# Patient Record
Sex: Male | Born: 1995 | Race: White | Hispanic: No | Marital: Single
Health system: Southern US, Community
[De-identification: ages and names within clinical notes are randomized; demographics above are authoritative.]

## PROBLEM LIST (undated history)

## (undated) DIAGNOSIS — R569 Unspecified convulsions: Secondary | ICD-10-CM

## (undated) DIAGNOSIS — B029 Zoster without complications: Secondary | ICD-10-CM

## (undated) DIAGNOSIS — K219 Gastro-esophageal reflux disease without esophagitis: Secondary | ICD-10-CM

## (undated) HISTORY — PX: TONSILLECTOMY: SUR1361

## (undated) HISTORY — PX: CHOLECYSTECTOMY: SHX55

## (undated) HISTORY — DX: Gastro-esophageal reflux disease without esophagitis: K21.9

## (undated) HISTORY — DX: Zoster without complications: B02.9

---

## 2020-09-30 ENCOUNTER — Emergency Department (HOSPITAL_COMMUNITY): Payer: BC Managed Care – PPO

## 2020-09-30 ENCOUNTER — Emergency Department (HOSPITAL_COMMUNITY)
Admission: EM | Admit: 2020-09-30 | Discharge: 2020-10-01 | Disposition: A | Discharge status: Home / Self Care | Payer: BC Managed Care – PPO | Attending: Emergency Medicine | Admitting: Emergency Medicine

## 2020-09-30 DIAGNOSIS — R569 Unspecified convulsions: Secondary | ICD-10-CM

## 2020-09-30 DIAGNOSIS — W19XXXA Unspecified fall, initial encounter: Secondary | ICD-10-CM | POA: Insufficient documentation

## 2020-09-30 DIAGNOSIS — Y906 Blood alcohol level of 120-199 mg/100 ml: Secondary | ICD-10-CM | POA: Insufficient documentation

## 2020-09-30 DIAGNOSIS — R791 Abnormal coagulation profile: Secondary | ICD-10-CM | POA: Insufficient documentation

## 2020-09-30 DIAGNOSIS — R109 Unspecified abdominal pain: Secondary | ICD-10-CM | POA: Diagnosis not present

## 2020-09-30 DIAGNOSIS — R200 Anesthesia of skin: Secondary | ICD-10-CM | POA: Diagnosis not present

## 2020-09-30 DIAGNOSIS — S199XXA Unspecified injury of neck, initial encounter: Secondary | ICD-10-CM | POA: Diagnosis not present

## 2020-09-30 DIAGNOSIS — S0990XA Unspecified injury of head, initial encounter: Secondary | ICD-10-CM | POA: Insufficient documentation

## 2020-09-30 DIAGNOSIS — M549 Dorsalgia, unspecified: Secondary | ICD-10-CM | POA: Diagnosis not present

## 2020-09-30 DIAGNOSIS — S299XXA Unspecified injury of thorax, initial encounter: Secondary | ICD-10-CM | POA: Insufficient documentation

## 2020-09-30 DIAGNOSIS — Z20822 Contact with and (suspected) exposure to covid-19: Secondary | ICD-10-CM | POA: Insufficient documentation

## 2020-09-30 DIAGNOSIS — G8384 Todd's paralysis (postepileptic): Secondary | ICD-10-CM

## 2020-09-30 DIAGNOSIS — M6281 Muscle weakness (generalized): Secondary | ICD-10-CM | POA: Diagnosis not present

## 2020-09-30 DIAGNOSIS — T1490XA Injury, unspecified, initial encounter: Secondary | ICD-10-CM

## 2020-09-30 LAB — URINALYSIS, MICROSCOPIC (REFLEX): Bacteria, UA: NONE SEEN

## 2020-09-30 LAB — COMPREHENSIVE METABOLIC PANEL
ALT: 17 U/L (ref 0–44)
AST: 25 U/L (ref 15–41)
Albumin: 4.6 g/dL (ref 3.5–5.0)
Alkaline Phosphatase: 55 U/L (ref 38–126)
Anion gap: 19 — ABNORMAL HIGH (ref 5–15)
BUN: 10 mg/dL (ref 6–20)
CO2: 13 mmol/L — ABNORMAL LOW (ref 22–32)
Calcium: 10 mg/dL (ref 8.9–10.3)
Chloride: 108 mmol/L (ref 98–111)
Creatinine, Ser: 1.01 mg/dL (ref 0.61–1.24)
GFR, Estimated: >60 mL/min (ref >60)
Glucose, Bld: 97 mg/dL (ref 70–99)
Potassium: 3.2 mmol/L — ABNORMAL LOW (ref 3.5–5.1)
Sodium: 140 mmol/L (ref 135–145)
Total Bilirubin: 0.7 mg/dL (ref 0.3–1.2)
Total Protein: 7.1 g/dL (ref 6.5–8.1)

## 2020-09-30 LAB — CBC
HCT: 44.3 % (ref 39.0–52.0)
Hemoglobin: 15.3 g/dL (ref 13.0–17.0)
MCH: 30.4 pg (ref 26.0–34.0)
MCHC: 34.5 g/dL (ref 30.0–36.0)
MCV: 87.9 fL (ref 80.0–100.0)
Platelets: 233 10*3/uL (ref 150–400)
RBC: 5.04 MIL/uL (ref 4.22–5.81)
RDW: 12.2 % (ref 11.5–15.5)
WBC: 9.6 10*3/uL (ref 4.0–10.5)
nRBC: 0 % (ref 0.0–0.2)

## 2020-09-30 LAB — SAMPLE TO BLOOD BANK

## 2020-09-30 LAB — URINALYSIS, ROUTINE W REFLEX MICROSCOPIC
Bilirubin Urine: NEGATIVE
Glucose, UA: NEGATIVE mg/dL
Ketones, ur: NEGATIVE mg/dL
Leukocytes,Ua: NEGATIVE
Nitrite: NEGATIVE
Protein, ur: NEGATIVE mg/dL
Specific Gravity, Urine: <1.005 — ABNORMAL LOW (ref 1.005–1.030)
pH: 6 (ref 5.0–8.0)

## 2020-09-30 LAB — I-STAT CHEM 8, ED
BUN: 7 mg/dL (ref 6–20)
Calcium, Ion: 1.2 mmol/L (ref 1.15–1.40)
Chloride: 108 mmol/L (ref 98–111)
Creatinine, Ser: 1 mg/dL (ref 0.61–1.24)
Glucose, Bld: 92 mg/dL (ref 70–99)
HCT: 46 % (ref 39.0–52.0)
Hemoglobin: 15.6 g/dL (ref 13.0–17.0)
Potassium: 3.1 mmol/L — ABNORMAL LOW (ref 3.5–5.1)
Sodium: 142 mmol/L (ref 135–145)
TCO2: 15 mmol/L — ABNORMAL LOW (ref 22–32)

## 2020-09-30 LAB — ETHANOL: Alcohol, Ethyl (B): 175 mg/dL — ABNORMAL HIGH (ref <10)

## 2020-09-30 LAB — PROTIME-INR
INR: 1 (ref 0.8–1.2)
Prothrombin Time: 13.1 seconds (ref 11.4–15.2)

## 2020-09-30 LAB — LACTIC ACID, PLASMA: Lactic Acid, Venous: 9.2 mmol/L (ref 0.5–1.9)
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
## 2020-09-30 IMAGING — DX DG CHEST 1V PORT
1 series · 1 of 1 positions shown · non-contrast
Comparison: Chest x-ray [DATE].

CLINICAL DATA: Trauma.

EXAM:
PORTABLE CHEST 1 VIEW

[chest]
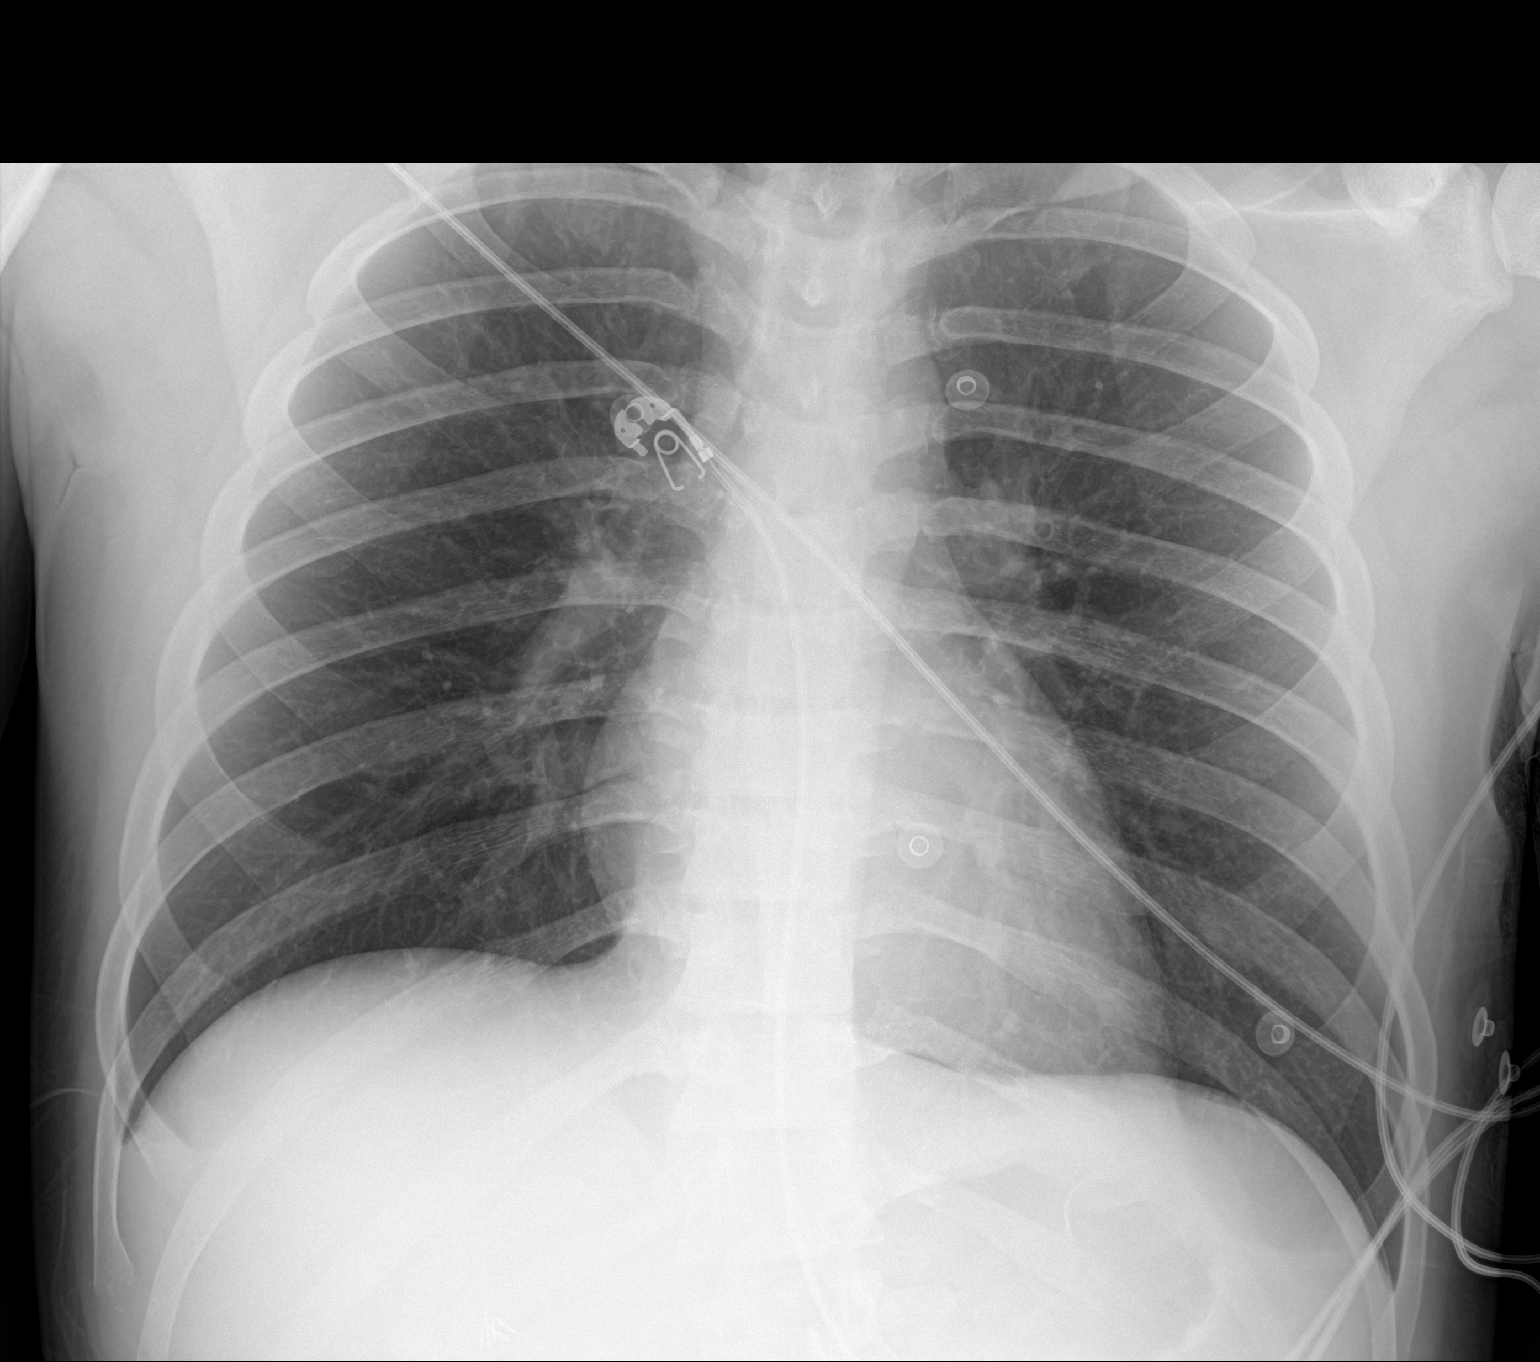

[1 of 1 positions shown; findings below may reference images not displayed]

FINDINGS: The heart size and mediastinal contours are within normal limits.
Both lungs are clear. The visualized skeletal structures are
unremarkable.
IMPRESSION: No active disease.
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
## 2020-09-30 MED ORDER — IOHEXOL 350 MG/ML SOLN
100.0000 mL | Freq: Once | INTRAVENOUS | Status: AC | PRN
  Administered 2020-09-30: 100 mL via INTRAVENOUS

## 2020-09-30 NOTE — ED Notes (Signed)
130/82 p 70
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
## 2020-09-30 NOTE — ED Notes (Signed)
The pt returned from c-t 
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
## 2020-09-30 NOTE — ED Notes (Signed)
To ct

## 2020-09-30 NOTE — ED Provider Notes (Signed)
[facility]   CSN: [id number] Arrival date & time: 09/30/20  2236     History No chief complaint on file.   Anthony Guerra is a 25 y.o. male.  The history is provided by medical records and the patient. No language interpreter was used.  Trauma Mechanism of injury: Fall Injury location: head/neck Injury location detail: neck and head Incident location: outdoors Arrived directly from scene: yes   Fall:      Fall occurred: running      Height of fall: 383ft ditch      Suspicion of alcohol use: yes      Suspicion of drug use: no  EMS/PTA data:      Ambulatory at scene: no      Blood loss: none      Airway interventions: none  Current symptoms:      Associated symptoms:            Reports back pain, headache and neck pain.            Denies abdominal pain, chest pain, nausea and vomiting. Seizures: no acute seizures seen here.     No past medical history on file.  There are no problems to display for this patient.     No family history on file.     Home Medications Prior to Admission medications   Not on File    Allergies    Patient has no allergy information on record.  Review of Systems   Review of Systems  Unable to perform ROS: Acuity of condition  Constitutional:  Negative for chills.  HENT:  Negative for congestion.   Respiratory:  Negative for cough, chest tightness, shortness of breath and wheezing.   Cardiovascular:  Negative for chest pain.  Gastrointestinal:  Negative for abdominal pain, constipation, diarrhea, nausea and vomiting.  Genitourinary:  Negative for flank pain.  Musculoskeletal:  Positive for back pain and neck pain.  Skin:  Negative for rash and wound.  Neurological:  Positive for weakness, numbness and headaches. Negative for speech difficulty. Seizures: no acute seizures seen here. All other systems reviewed and are negative.  Physical Exam Updated Vital Signs BP  121/77   Pulse 60   Temp (!) 96.8 F (36 C)   Resp 19   SpO2 99%   Physical Exam Vitals and nursing note reviewed.  Constitutional:      General: He is in acute distress.     Appearance: He is well-developed. He is ill-appearing.  HENT:     Head: Normocephalic and atraumatic.     Nose: No congestion or rhinorrhea.     Mouth/Throat:     Mouth: Mucous membranes are moist.     Pharynx: No oropharyngeal exudate or posterior oropharyngeal erythema.  Eyes:     Extraocular Movements: Extraocular movements intact.     Conjunctiva/sclera: Conjunctivae normal.     Pupils: Pupils are equal, round, and reactive to light.  Neck:     Comments: In C collar Cardiovascular:     Rate and Rhythm: Normal rate and regular rhythm.     Heart sounds: No murmur heard. Pulmonary:     Effort: Pulmonary effort is normal. No respiratory distress.     Breath sounds: Normal breath sounds. No wheezing, rhonchi or rales.  Chest:     Chest wall: No tenderness.  Abdominal:     General: Abdomen is flat.     Palpations: Abdomen is soft.  Tenderness: There is no abdominal tenderness. There is no right CVA tenderness, left CVA tenderness, guarding or rebound.  Musculoskeletal:        General: Tenderness present.     Cervical back: Tenderness present.  Skin:    General: Skin is warm and dry.     Findings: No erythema.  Neurological:     Mental Status: He is alert.     Sensory: Sensory deficit present.     Motor: Weakness present.     Comments: Patient has numbness and weakness of right arm and right leg compared to left.  Intact sensation and strength in left side.  Decreased rectal tone on my exam.  Tenderness in the cervical spine and occiput.  Pupils symmetric and reactive.  Speech clear when he is awake.   symmetric smile.      ED Results / Procedures / Treatments   Labs (all labs ordered are listed, but only abnormal results are displayed) Labs Reviewed  COMPREHENSIVE METABOLIC PANEL - Abnormal;  Notable for the following components:      Result Value   Potassium 3.2 (*)    CO2 13 (*)    Anion gap 19 (*)    All other components within normal limits  ETHANOL - Abnormal; Notable for the following components:   Alcohol, Ethyl (B) 175 (*)    All other components within normal limits  LACTIC ACID, PLASMA - Abnormal; Notable for the following components:   Lactic Acid, Venous 9.2 (*)    All other components within normal limits  URINALYSIS, ROUTINE W REFLEX MICROSCOPIC - Abnormal; Notable for the following components:   Specific Gravity, Urine <1.005 (*)    Hgb urine dipstick TRACE (*)    All other components within normal limits  I-STAT CHEM 8, ED - Abnormal; Notable for the following components:   Potassium 3.1 (*)    TCO2 15 (*)    All other components within normal limits  RESP PANEL BY RT-PCR (FLU A&B, COVID) ARPGX2  CBC  PROTIME-INR  URINALYSIS, MICROSCOPIC (REFLEX)  SAMPLE TO BLOOD BANK    EKG None  Radiology CT HEAD WO CONTRAST ( )  Result Date: 09/30/2020 CLINICAL DATA:  Head trauma, mod-severe; Neck trauma, focal neuro deficit or paresthesia (Age 38-64y); Abdominal trauma; Chest trauma, mod-severe. Intoxication, fall from height, spinal deformity EXAM: CT HEAD WITHOUT CONTRAST CT CERVICAL SPINE WITHOUT CONTRAST CT CHEST, ABDOMEN AND PELVIS WITH CONTRAST TECHNIQUE: Contiguous axial images were obtained from the base of the skull through the vertex without intravenous contrast. Multidetector CT imaging of the cervical spine was performed without intravenous contrast. Multiplanar CT image reconstructions were also generated. Multidetector CT imaging of the chest, abdomen and pelvis was performed following the standard protocol during bolus administration of intravenous contrast. CONTRAST:  OMNIPAQUE IOHEXOL 350 MG/ML SOLN COMPARISON:  None. FINDINGS: CT HEAD FINDINGS Brain: Normal anatomic configuration. No abnormal intra or extra-axial mass lesion or fluid  collection. No abnormal mass effect or midline shift. No evidence of acute intracranial hemorrhage or infarct. Ventricular size is normal. Cerebellum unremarkable. Vascular: Unremarkable Skull: Intact Sinuses/Orbits: Paranasal sinuses are clear. Orbits are unremarkable. Other: Mastoid air cells and middle ear cavities are clear. CT CERVICAL FINDINGS Alignment: Normal. Skull base and vertebrae: No acute fracture. No primary bone lesion or focal pathologic process. Soft tissues and spinal canal: No prevertebral fluid or swelling. No visible canal hematoma. Disc levels: Intervertebral disc heights and vertebral body heights are preserved. The prevertebral soft tissues are not thickened. No significant  canal stenosis or neuroforaminal narrowing. No arthropathy. Other:  None CT CHEST FINDINGS Cardiovascular: No significant vascular findings. Normal heart size. No pericardial effusion. Mediastinum/Nodes: No enlarged mediastinal, hilar, or axillary lymph nodes. Thyroid gland, trachea, and esophagus demonstrate no significant findings. Lungs/Pleura: Lungs are clear. No pleural effusion or pneumothorax. Musculoskeletal: No acute bone abnormality within the thorax. CT ABDOMEN PELVIS FINDINGS Hepatobiliary: No focal liver abnormality is seen. No subcapsular or perihepatic fluid collection. Status post cholecystectomy. No biliary dilatation. Pancreas: Unremarkable Spleen: No splenic injury or perisplenic hematoma. Adrenals/Urinary Tract: The adrenal glands are unremarkable. The kidneys are normal in size and position. Normal cortical enhancement. No perirenal fluid identified. There is multiple punctate nonobstructing calculi noted within the left kidney measuring up to 3 mm in greatest dimension. No hydronephrosis. No ureteral calculi. The bladder is unremarkable. Stomach/Bowel: Stomach is within normal limits. Appendix appears normal. No evidence of bowel wall thickening, distention, or inflammatory changes. No free  intraperitoneal gas or fluid. Vascular/Lymphatic: No significant vascular findings are present. No enlarged abdominal or pelvic lymph nodes. Reproductive: Prostate is unremarkable. Other: No abdominal wall hernia. Musculoskeletal: No acute bone abnormality within the abdomen and pelvis. IMPRESSION: No acute intracranial injury.  No calvarial fracture. No acute fracture or listhesis of the cervical spine. No acute intrathoracic or intra-abdominal injury. Electronically Signed   By: Helyn Numbers M.D.   On: 09/30/2020 23:37   CT Chest W Contrast  Result Date: 09/30/2020 CLINICAL DATA:  Head trauma, mod-severe; Neck trauma, focal neuro deficit or paresthesia (Age 39-64y); Abdominal trauma; Chest trauma, mod-severe. Intoxication, fall from height, spinal deformity EXAM: CT HEAD WITHOUT CONTRAST CT CERVICAL SPINE WITHOUT CONTRAST CT CHEST, ABDOMEN AND PELVIS WITH CONTRAST TECHNIQUE: Contiguous axial images were obtained from the base of the skull through the vertex without intravenous contrast. Multidetector CT imaging of the cervical spine was performed without intravenous contrast. Multiplanar CT image reconstructions were also generated. Multidetector CT imaging of the chest, abdomen and pelvis was performed following the standard protocol during bolus administration of intravenous contrast. CONTRAST:  OMNIPAQUE IOHEXOL 350 MG/ML SOLN COMPARISON:  None. FINDINGS: CT HEAD FINDINGS Brain: Normal anatomic configuration. No abnormal intra or extra-axial mass lesion or fluid collection. No abnormal mass effect or midline shift. No evidence of acute intracranial hemorrhage or infarct. Ventricular size is normal. Cerebellum unremarkable. Vascular: Unremarkable Skull: Intact Sinuses/Orbits: Paranasal sinuses are clear. Orbits are unremarkable. Other: Mastoid air cells and middle ear cavities are clear. CT CERVICAL FINDINGS Alignment: Normal. Skull base and vertebrae: No acute fracture. No primary bone lesion or  focal pathologic process. Soft tissues and spinal canal: No prevertebral fluid or swelling. No visible canal hematoma. Disc levels: Intervertebral disc heights and vertebral body heights are preserved. The prevertebral soft tissues are not thickened. No significant canal stenosis or neuroforaminal narrowing. No arthropathy. Other:  None CT CHEST FINDINGS Cardiovascular: No significant vascular findings. Normal heart size. No pericardial effusion. Mediastinum/Nodes: No enlarged mediastinal, hilar, or axillary lymph nodes. Thyroid gland, trachea, and esophagus demonstrate no significant findings. Lungs/Pleura: Lungs are clear. No pleural effusion or pneumothorax. Musculoskeletal: No acute bone abnormality within the thorax. CT ABDOMEN PELVIS FINDINGS Hepatobiliary: No focal liver abnormality is seen. No subcapsular or perihepatic fluid collection. Status post cholecystectomy. No biliary dilatation. Pancreas: Unremarkable Spleen: No splenic injury or perisplenic hematoma. Adrenals/Urinary Tract: The adrenal glands are unremarkable. The kidneys are normal in size and position. Normal cortical enhancement. No perirenal fluid identified. There is multiple punctate nonobstructing calculi noted within the left kidney  measuring up to 3 mm in greatest dimension. No hydronephrosis. No ureteral calculi. The bladder is unremarkable. Stomach/Bowel: Stomach is within normal limits. Appendix appears normal. No evidence of bowel wall thickening, distention, or inflammatory changes. No free intraperitoneal gas or fluid. Vascular/Lymphatic: No significant vascular findings are present. No enlarged abdominal or pelvic lymph nodes. Reproductive: Prostate is unremarkable. Other: No abdominal wall hernia. Musculoskeletal: No acute bone abnormality within the abdomen and pelvis. IMPRESSION: No acute intracranial injury.  No calvarial fracture. No acute fracture or listhesis of the cervical spine. No acute intrathoracic or intra-abdominal  injury. Electronically Signed   By: Helyn Numbers M.D.   On: 09/30/2020 23:37   CT Cervical Spine Wo Contrast  Result Date: 09/30/2020 CLINICAL DATA:  Head trauma, mod-severe; Neck trauma, focal neuro deficit or paresthesia (Age 70-64y); Abdominal trauma; Chest trauma, mod-severe. Intoxication, fall from height, spinal deformity EXAM: CT HEAD WITHOUT CONTRAST CT CERVICAL SPINE WITHOUT CONTRAST CT CHEST, ABDOMEN AND PELVIS WITH CONTRAST TECHNIQUE: Contiguous axial images were obtained from the base of the skull through the vertex without intravenous contrast. Multidetector CT imaging of the cervical spine was performed without intravenous contrast. Multiplanar CT image reconstructions were also generated. Multidetector CT imaging of the chest, abdomen and pelvis was performed following the standard protocol during bolus administration of intravenous contrast. CONTRAST:  OMNIPAQUE IOHEXOL 350 MG/ML SOLN COMPARISON:  None. FINDINGS: CT HEAD FINDINGS Brain: Normal anatomic configuration. No abnormal intra or extra-axial mass lesion or fluid collection. No abnormal mass effect or midline shift. No evidence of acute intracranial hemorrhage or infarct. Ventricular size is normal. Cerebellum unremarkable. Vascular: Unremarkable Skull: Intact Sinuses/Orbits: Paranasal sinuses are clear. Orbits are unremarkable. Other: Mastoid air cells and middle ear cavities are clear. CT CERVICAL FINDINGS Alignment: Normal. Skull base and vertebrae: No acute fracture. No primary bone lesion or focal pathologic process. Soft tissues and spinal canal: No prevertebral fluid or swelling. No visible canal hematoma. Disc levels: Intervertebral disc heights and vertebral body heights are preserved. The prevertebral soft tissues are not thickened. No significant canal stenosis or neuroforaminal narrowing. No arthropathy. Other:  None CT CHEST FINDINGS Cardiovascular: No significant vascular findings. Normal heart size. No pericardial  effusion. Mediastinum/Nodes: No enlarged mediastinal, hilar, or axillary lymph nodes. Thyroid gland, trachea, and esophagus demonstrate no significant findings. Lungs/Pleura: Lungs are clear. No pleural effusion or pneumothorax. Musculoskeletal: No acute bone abnormality within the thorax. CT ABDOMEN PELVIS FINDINGS Hepatobiliary: No focal liver abnormality is seen. No subcapsular or perihepatic fluid collection. Status post cholecystectomy. No biliary dilatation. Pancreas: Unremarkable Spleen: No splenic injury or perisplenic hematoma. Adrenals/Urinary Tract: The adrenal glands are unremarkable. The kidneys are normal in size and position. Normal cortical enhancement. No perirenal fluid identified. There is multiple punctate nonobstructing calculi noted within the left kidney measuring up to 3 mm in greatest dimension. No hydronephrosis. No ureteral calculi. The bladder is unremarkable. Stomach/Bowel: Stomach is within normal limits. Appendix appears normal. No evidence of bowel wall thickening, distention, or inflammatory changes. No free intraperitoneal gas or fluid. Vascular/Lymphatic: No significant vascular findings are present. No enlarged abdominal or pelvic lymph nodes. Reproductive: Prostate is unremarkable. Other: No abdominal wall hernia. Musculoskeletal: No acute bone abnormality within the abdomen and pelvis. IMPRESSION: No acute intracranial injury.  No calvarial fracture. No acute fracture or listhesis of the cervical spine. No acute intrathoracic or intra-abdominal injury. Electronically Signed   By: Helyn Numbers M.D.   On: 09/30/2020 23:37   CT ABDOMEN PELVIS W CONTRAST  Result Date:  09/30/2020 CLINICAL DATA:  Head trauma, mod-severe; Neck trauma, focal neuro deficit or paresthesia (Age 71-64y); Abdominal trauma; Chest trauma, mod-severe. Intoxication, fall from height, spinal deformity EXAM: CT HEAD WITHOUT CONTRAST CT CERVICAL SPINE WITHOUT CONTRAST CT CHEST, ABDOMEN AND PELVIS WITH CONTRAST  TECHNIQUE: Contiguous axial images were obtained from the base of the skull through the vertex without intravenous contrast. Multidetector CT imaging of the cervical spine was performed without intravenous contrast. Multiplanar CT image reconstructions were also generated. Multidetector CT imaging of the chest, abdomen and pelvis was performed following the standard protocol during bolus administration of intravenous contrast. CONTRAST:  OMNIPAQUE IOHEXOL 350 MG/ML SOLN COMPARISON:  None. FINDINGS: CT HEAD FINDINGS Brain: Normal anatomic configuration. No abnormal intra or extra-axial mass lesion or fluid collection. No abnormal mass effect or midline shift. No evidence of acute intracranial hemorrhage or infarct. Ventricular size is normal. Cerebellum unremarkable. Vascular: Unremarkable Skull: Intact Sinuses/Orbits: Paranasal sinuses are clear. Orbits are unremarkable. Other: Mastoid air cells and middle ear cavities are clear. CT CERVICAL FINDINGS Alignment: Normal. Skull base and vertebrae: No acute fracture. No primary bone lesion or focal pathologic process. Soft tissues and spinal canal: No prevertebral fluid or swelling. No visible canal hematoma. Disc levels: Intervertebral disc heights and vertebral body heights are preserved. The prevertebral soft tissues are not thickened. No significant canal stenosis or neuroforaminal narrowing. No arthropathy. Other:  None CT CHEST FINDINGS Cardiovascular: No significant vascular findings. Normal heart size. No pericardial effusion. Mediastinum/Nodes: No enlarged mediastinal, hilar, or axillary lymph nodes. Thyroid gland, trachea, and esophagus demonstrate no significant findings. Lungs/Pleura: Lungs are clear. No pleural effusion or pneumothorax. Musculoskeletal: No acute bone abnormality within the thorax. CT ABDOMEN PELVIS FINDINGS Hepatobiliary: No focal liver abnormality is seen. No subcapsular or perihepatic fluid collection. Status post cholecystectomy.  No biliary dilatation. Pancreas: Unremarkable Spleen: No splenic injury or perisplenic hematoma. Adrenals/Urinary Tract: The adrenal glands are unremarkable. The kidneys are normal in size and position. Normal cortical enhancement. No perirenal fluid identified. There is multiple punctate nonobstructing calculi noted within the left kidney measuring up to 3 mm in greatest dimension. No hydronephrosis. No ureteral calculi. The bladder is unremarkable. Stomach/Bowel: Stomach is within normal limits. Appendix appears normal. No evidence of bowel wall thickening, distention, or inflammatory changes. No free intraperitoneal gas or fluid. Vascular/Lymphatic: No significant vascular findings are present. No enlarged abdominal or pelvic lymph nodes. Reproductive: Prostate is unremarkable. Other: No abdominal wall hernia. Musculoskeletal: No acute bone abnormality within the abdomen and pelvis. IMPRESSION: No acute intracranial injury.  No calvarial fracture. No acute fracture or listhesis of the cervical spine. No acute intrathoracic or intra-abdominal injury. Electronically Signed   By: Helyn Numbers M.D.   On: 09/30/2020 23:37   DG Chest Port 1 View  Result Date: 09/30/2020 CLINICAL DATA:  Trauma. EXAM: PORTABLE CHEST 1 VIEW COMPARISON:  Chest x-ray 12/26/2016. FINDINGS: The heart size and mediastinal contours are within normal limits. Both lungs are clear. The visualized skeletal structures are unremarkable. IMPRESSION: No active disease. Electronically Signed   By: Darliss Cheney M.D.   On: 09/30/2020 23:07    Procedures Procedures   Medications Ordered in ED Medications  iohexol (OMNIPAQUE) 350 MG/ML injection 100 mL (100 mLs Intravenous Contrast Given 09/30/20 2314)    ED Course  I have reviewed the triage vital signs and the nursing notes.  Pertinent labs & imaging results that were available during my care of the patient were reviewed by me and considered in my medical decision  making (see chart for  details).    MDM Rules/Calculators/A&P                           Anthony Guerra is a 25 y.o. male with a reported past medical history significant for seizures who presents as a level 2 trauma.  According to EMS, patient was running and tried to jump over a ditch when he "face planted" and his legs and body bent backwards over him in a scorpion like position causing immediate onset of pain to his neck.  He also had sudden onset of numbness and weakness in his right arm and right leg and was altered.  According to EMS, patient was unable to feel or move his right leg and during transfer that is progressed to his right arm.  Patient is reportedly right-handed.  He is reporting significant headache and neck pain but is denying any back pain or chest pain or abdominal pain.  Airway appears to be intact and breath sounds are equal bilaterally.  Chest was nontender but EMS reported possible tenderness on the right chest wall initially.  Portable chest x-ray was obtained and I did not see evidence of acute rib fracture or pneumothorax.  On further exam, patient has no sensation in his right leg and minimal sensation in his right arm.  Also significant weakness in the right leg and right arm.  He was having some posturing on the right arm initially.  Symmetric smile.  Speech is clear when he wakes up.  Symmetric smile.  Neck is tender and in cervical mobilization collar.   Clinically I am very concerned about a cord injury or spinal fracture in the C-spine.  We will get CT pan scans.  Patient quickly went to CT scanner and the bony imaging did not show evidence of acute fracture.  I spoke with neurosurgery who recommends MRI and MRA of the head and neck to look for cord injury or intracranial injury.  Given his history of seizures, it is possible to have a Todd's paralysis however given the numbness in the right leg this was felt to be less likely.  Patient will get MRI for further evaluation and  anticipate he will need admission for further management.   Final Clinical Impression(s) / ED Diagnoses Final diagnoses:  Trauma     Clinical Impression: 1. Trauma     Disposition: Admit  This note was prepared with assistance of Dragon voice recognition software. Occasional wrong-word or sound-a-like substitutions may have occurred due to the inherent limitations of voice recognition software.     Tynika Luddy, Canary Brim, MD 10/01/20 [phone]

## 2020-10-01 ENCOUNTER — Emergency Department (HOSPITAL_COMMUNITY): Payer: BC Managed Care – PPO

## 2020-10-01 LAB — RESP PANEL BY RT-PCR (FLU A&B, COVID) ARPGX2
Influenza A by PCR: NEGATIVE
Influenza B by PCR: NEGATIVE
SARS Coronavirus 2 by RT PCR: NEGATIVE

## 2020-10-01 IMAGING — MR MR HEAD W/O CM
6 of 10 series · 28 of 48 positions shown · IV contrast (gadavist)
Comparison: CT from [DATE].

CLINICAL DATA: Initial evaluation for right-sided numbness and
weakness.

EXAM:
MRI HEAD WITHOUT CONTRAST
MRA HEAD WITHOUT CONTRAST
MRA NECK WITHOUT AND WITH CONTRAST
TECHNIQUE: Multiplanar, multi-echo pulse sequences of the brain and surrounding
structures were acquired without intravenous contrast. Angiographic
images of the Circle of Willis were acquired using MRA technique
without intravenous contrast. Angiographic images of the neck were
acquired using MRA technique without and with intravenous contrast.
Carotid stenosis measurements (when applicable) are obtained
utilizing NASCET criteria, using the distal internal carotid
diameter as the denominator.
CONTRAST:  7mL GADAVIST GADOBUTROL 1 MMOL/ML IV SOLN

[Series 2: DWI · axial · 3.0mm · 0.94mm/px · z∈[-84,+69]mm · 9 of 104 slices shown (1 of 2)]
[im 1/104]
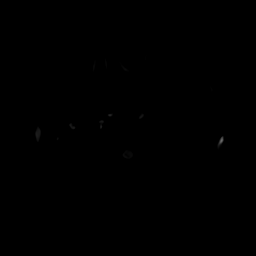
[im 13/104]
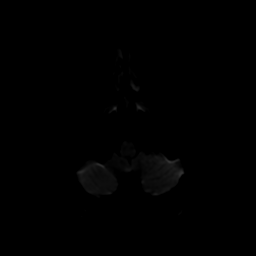
[im 26/104]
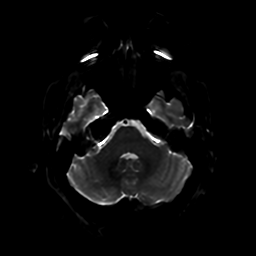
[im 39/104]
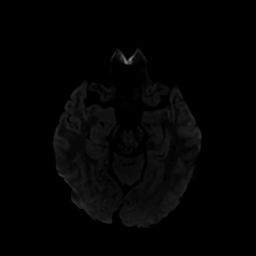
[im 52/104]
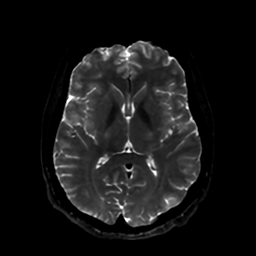
[im 65/104]
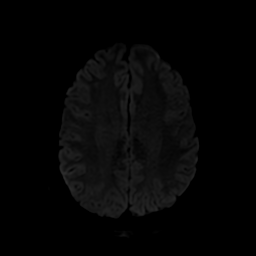
[im 78/104]
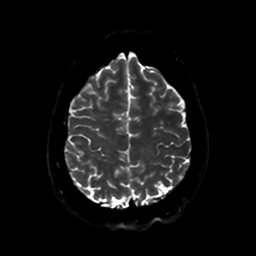
[im 91/104]
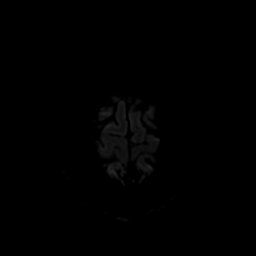
[im 104/104]
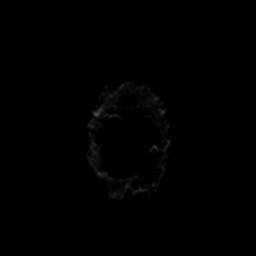

[Series 3: DWI · coronal · 4.0mm · 0.94mm/px · 7 of 79 slices shown (2 of 2)]
[im 1/79]
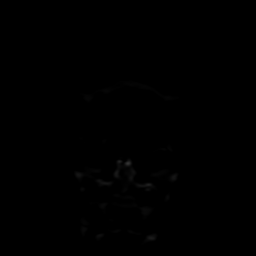
[im 14/79]
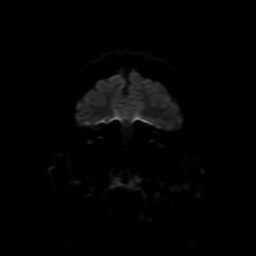
[im 27/79]
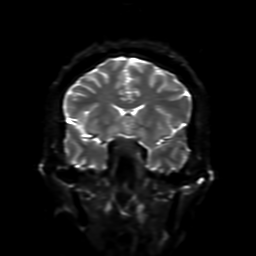
[im 40/79]
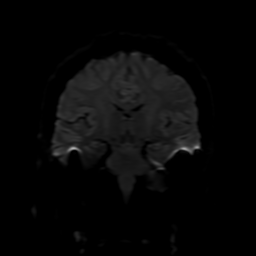
[im 53/79]
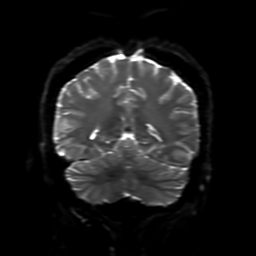
[im 66/79]
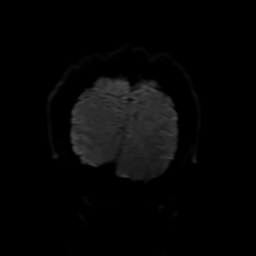
[im 79/79]
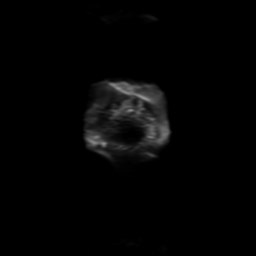

[Series 4: FLAIR · sagittal · 5.0mm · 0.23mm/px · 2 of 26 slices shown (1 of 2)]
[im 1/26]
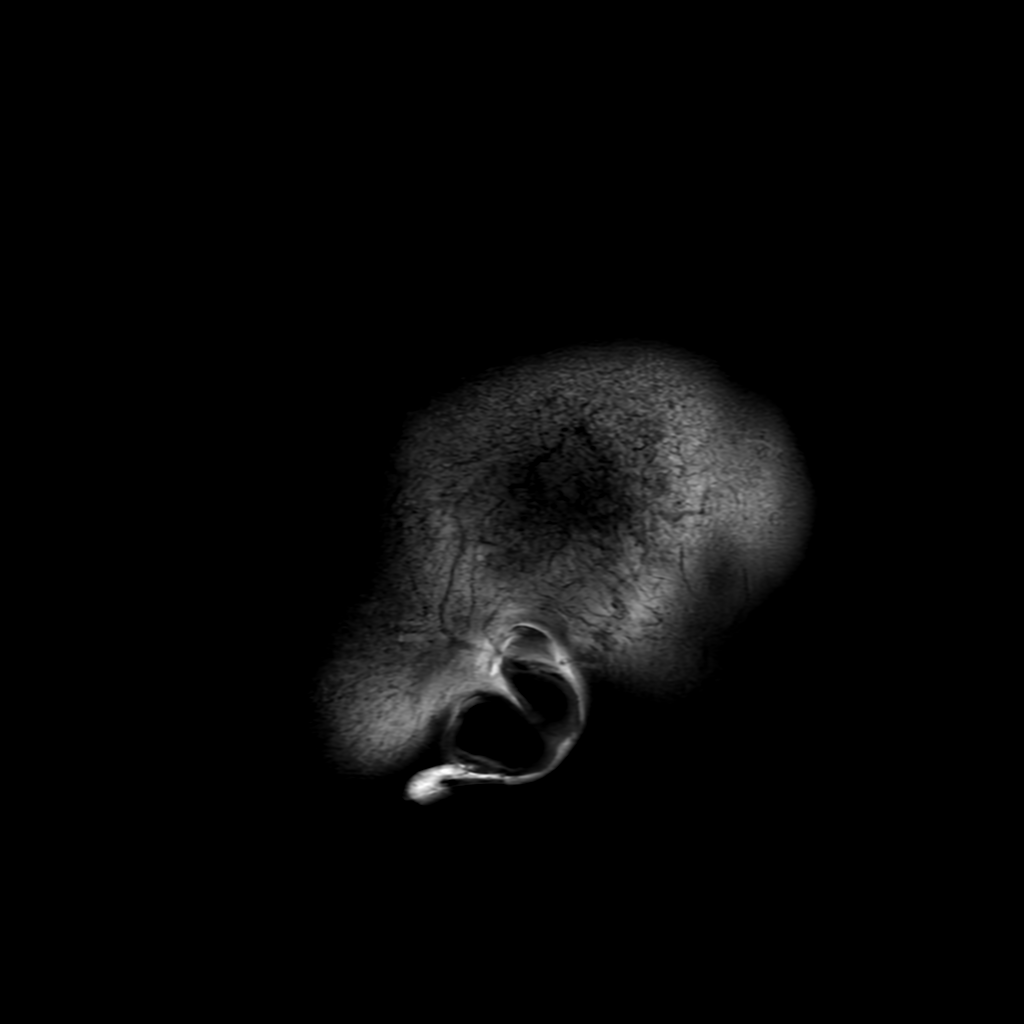
[im 26/26]
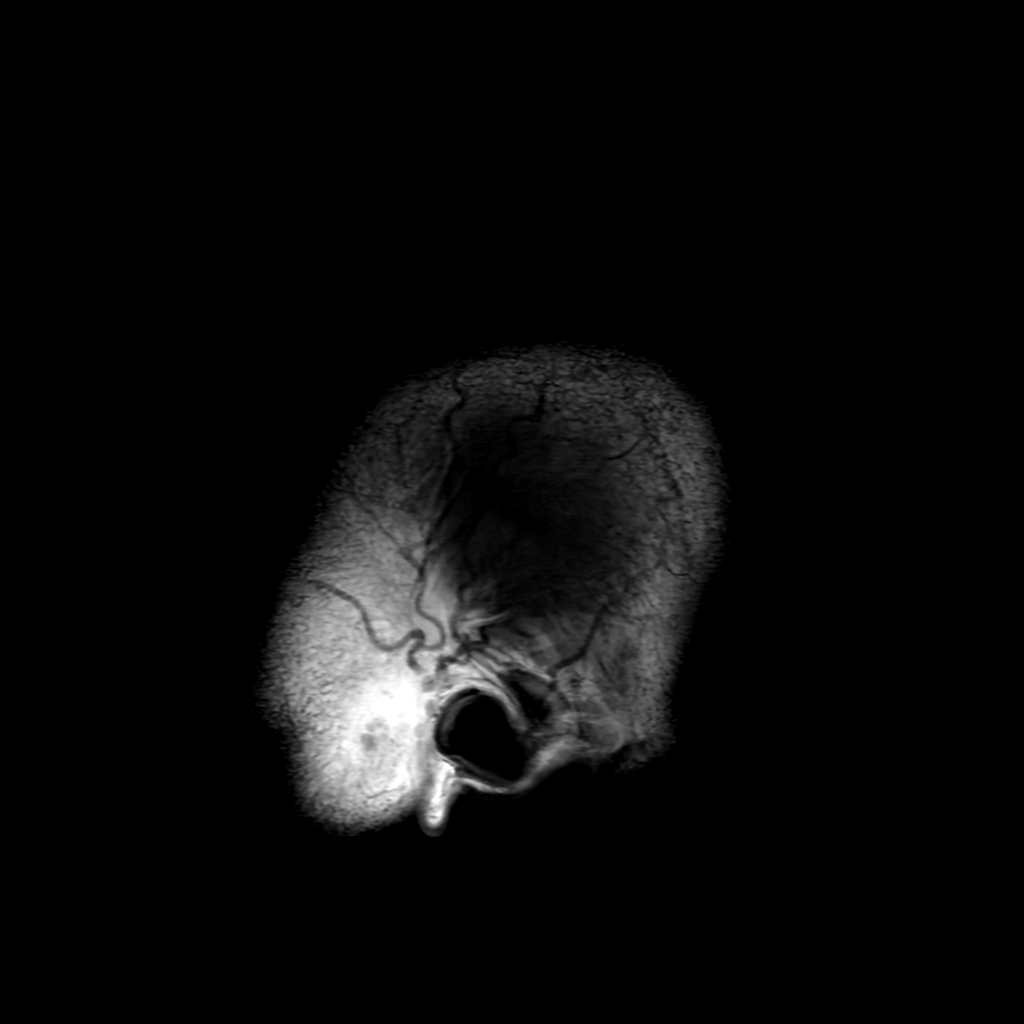

[Series 6: FLAIR · axial · 4.0mm · 0.45mm/px · z∈[-94,+54]mm · 3 of 35 slices shown (2 of 2)]
[im 1/35]
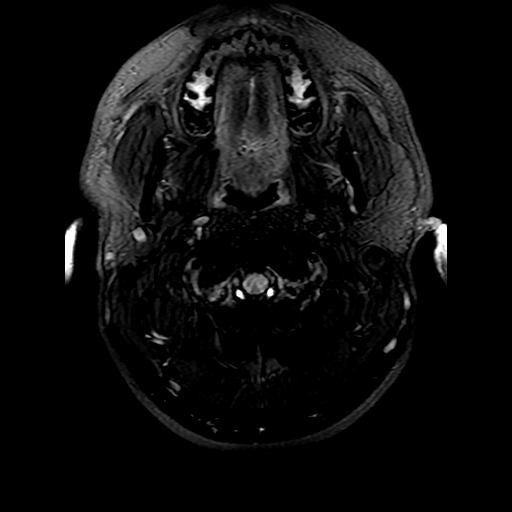
[im 18/35]
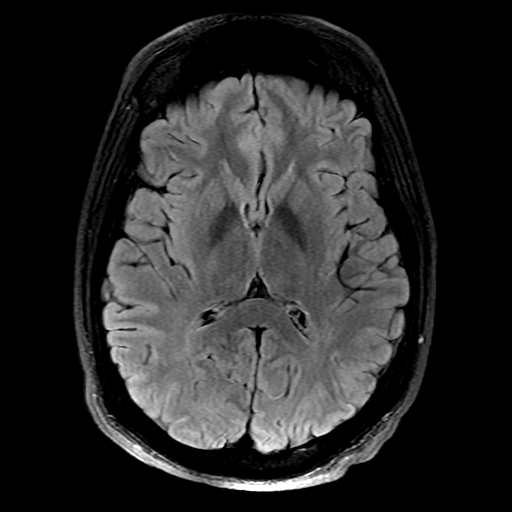
[im 35/35]
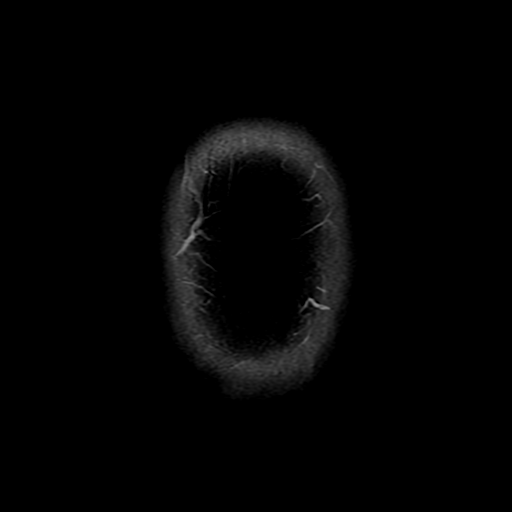

[Series 250: ADC · axial · 3.0mm · 0.94mm/px · z∈[-84,+69]mm · 5 of 52 slices shown (1 of 2)]
[im 1/52]
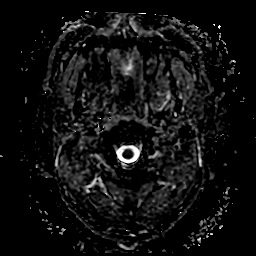
[im 13/52]
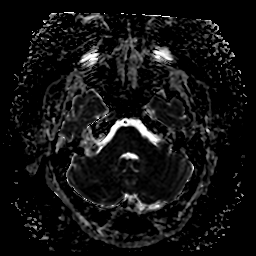
[im 26/52]
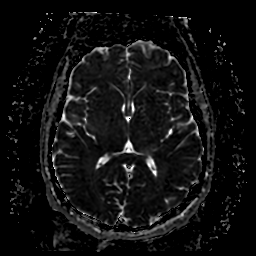
[im 39/52]
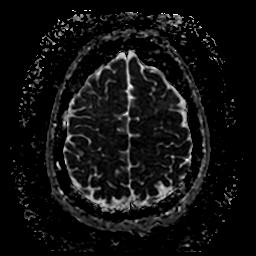
[im 52/52]
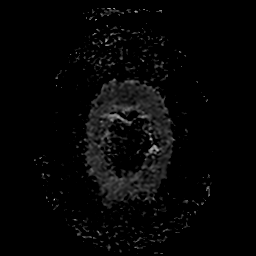

[Series 350: ADC · coronal · 4.0mm · 0.94mm/px · 2 of 40 slices shown (2 of 2)]
[im 1/40]
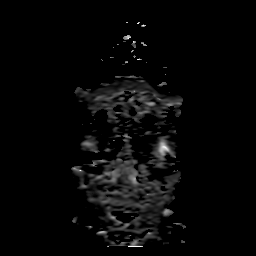
[im 20/40]
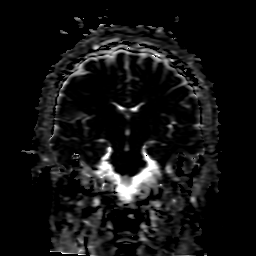

[28 of 48 positions shown; findings below may reference images not displayed]

FINDINGS: MRI HEAD FINDINGS

Brain: Cerebral volume within normal limits for patient age. No
focal parenchymal signal abnormality identified.

No abnormal foci of restricted diffusion to suggest acute or
subacute ischemia. Gray-white matter differentiation well
maintained. No encephalomalacia to suggest chronic infarction. No
foci of susceptibility artifact to suggest acute or chronic
intracranial hemorrhage.

No mass lesion, midline shift or mass effect. No hydrocephalus. No
extra-axial fluid collection.

Pituitary gland and suprasellar region are normal. Midline
structures intact and normal.

Vascular: Major intracranial vascular flow voids well maintained and
normal in appearance.

Skull and upper cervical spine: Craniocervical junction normal.
Visualized upper cervical spine within normal limits. Bone marrow
signal intensity normal. No scalp soft tissue abnormality.

Sinuses/Orbits: Globes and orbital soft tissues within normal
limits.

Mild scattered mucosal thickening noted within the ethmoidal air
cells. Paranasal sinuses are otherwise largely clear. Trace right
mastoid effusion, of doubtful significance. Inner ear structures
grossly normal.

Other: None.

MRA HEAD FINDINGS

Anterior circulation: Both internal carotid arteries widely patent
to the termini without stenosis. A1 segments widely patent. Normal
anterior communicating artery complex. Both anterior cerebral
arteries widely patent to their distal aspects without stenosis. No
M1 stenosis or occlusion. Normal MCA bifurcations. Distal MCA
branches well perfused and symmetric.

Posterior circulation: Both V4 segments patent to the
vertebrobasilar junction without stenosis. Both PICA origins patent
and normal. Basilar widely patent to its distal aspect without
stenosis. Superior cerebellar arteries patent bilaterally. Both PCAs
primarily supplied via the basilar and are well perfused to there
distal aspects.

Anatomic variants: None significant.  No aneurysm.

MRA NECK FINDINGS

Aortic arch: Visualized aortic arch normal in caliber with normal
branch pattern. No stenosis or other abnormality about the origin of
the great vessels.

Right carotid system: Right common and internal carotid arteries
widely patent without stenosis, evidence for dissection or
occlusion.

Left carotid system: Left common and internal carotid arteries
widely patent without stenosis, evidence for dissection or
occlusion.

Vertebral arteries: Left vertebral artery arises directly from the
aortic arch. Vertebral arteries patent without stenosis, evidence
for dissection or occlusion.

Other: None
IMPRESSION: 1. Normal brain MRI. No acute intracranial abnormality.
2. Normal intracranial MRA.
3. Normal MRA of the neck.

## 2020-10-01 IMAGING — MR MR CERVICAL SPINE W/O CM
4 of 6 series · 19 of 48 positions shown · non-contrast
Comparison: Prior CT from [DATE].

CLINICAL DATA: Initial evaluation for acute neck trauma.

EXAM:
MRI CERVICAL SPINE WITHOUT CONTRAST
TECHNIQUE: Multiplanar, multisequence MR imaging of the cervical spine was
performed. No intravenous contrast was administered.

[Series 3: T2 · sagittal · 3.0mm · 0.43mm/px · 2 of 16 slices shown (1 of 2)]
[im 1/16]
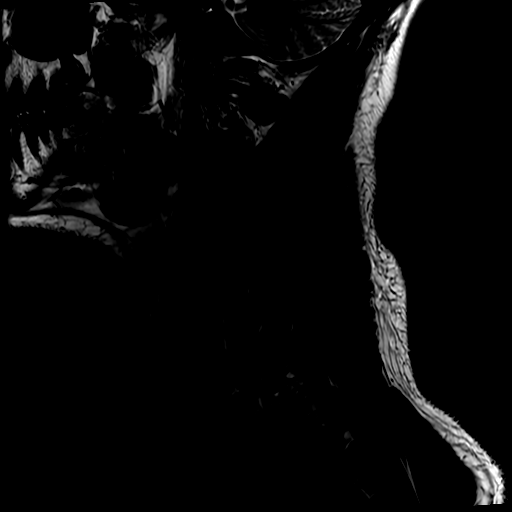
[im 16/16]
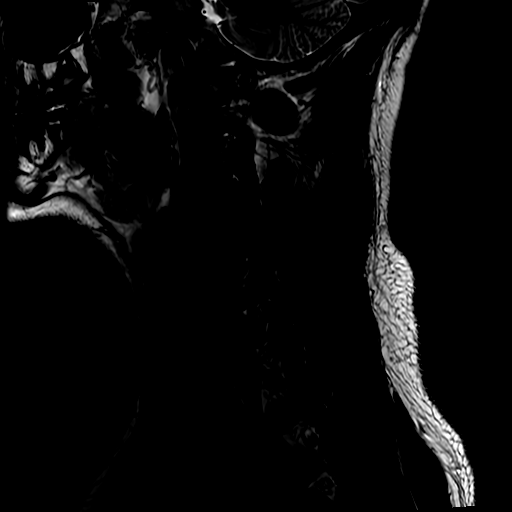

[Series 5: STIR · sagittal · 3.0mm · 0.43mm/px · 3 of 16 slices shown]
[im 1/16]
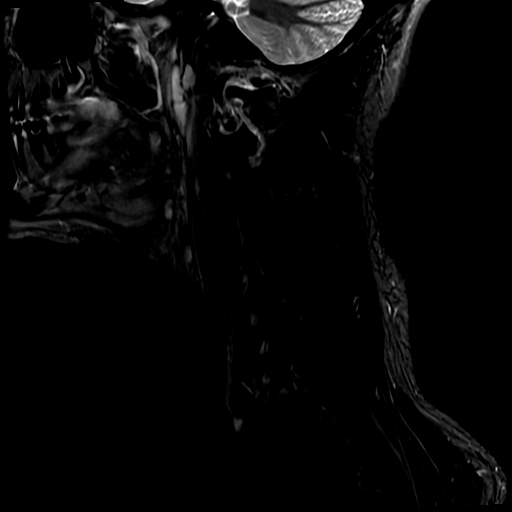
[im 8/16]
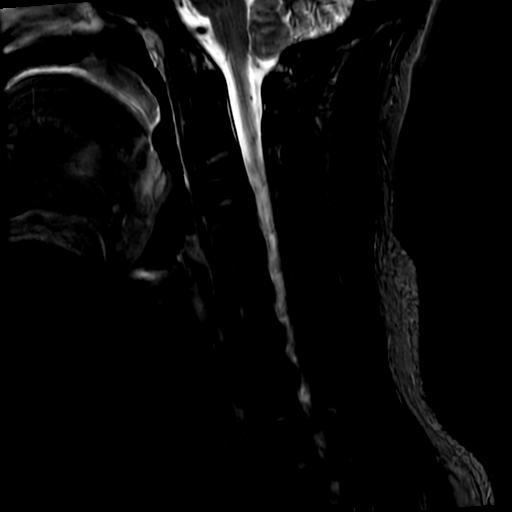
[im 16/16]
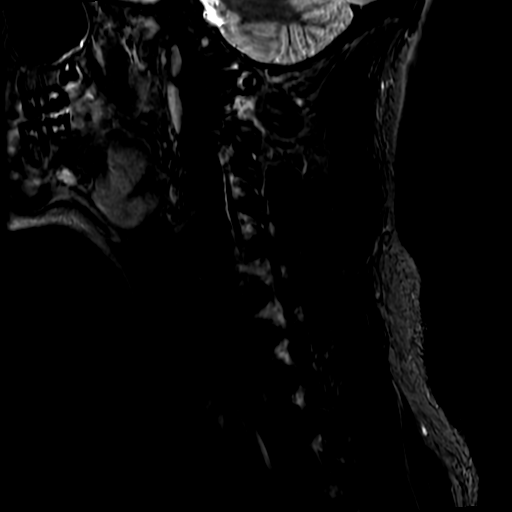

[Series 7: T2 · axial · 3.0mm · 0.35mm/px · z∈[-255,-85]mm · 8 of 54 slices shown (2 of 2)]
[im 1/54]
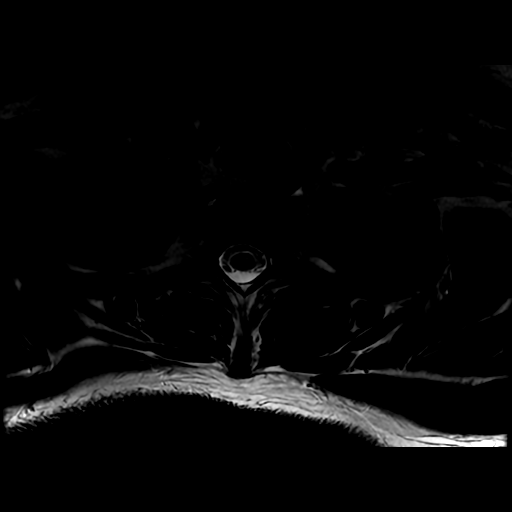
[im 8/54]
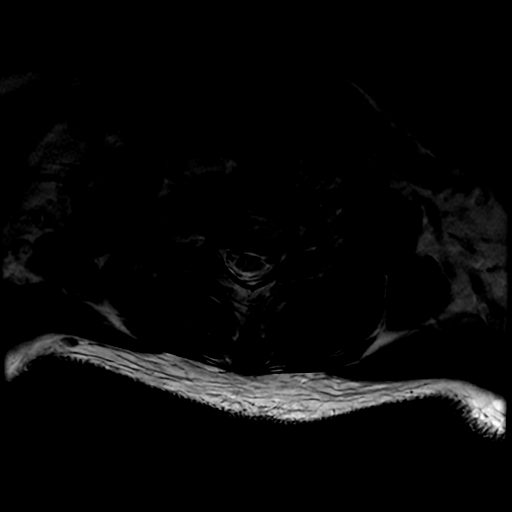
[im 16/54]
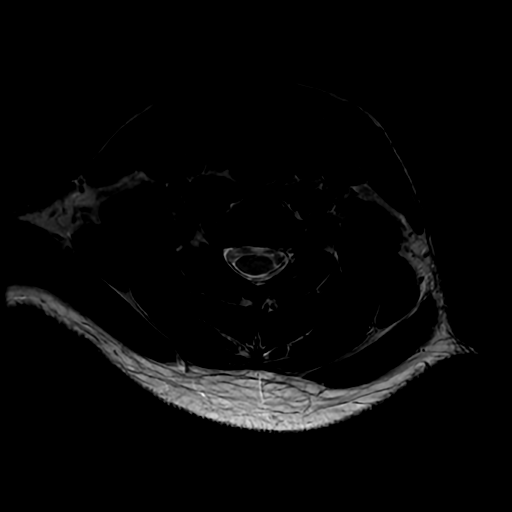
[im 23/54]
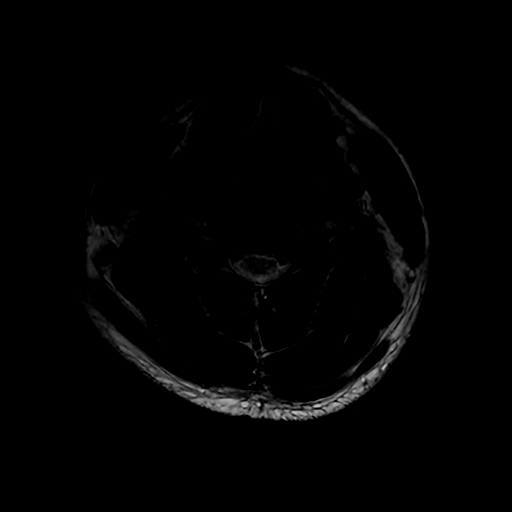
[im 31/54]
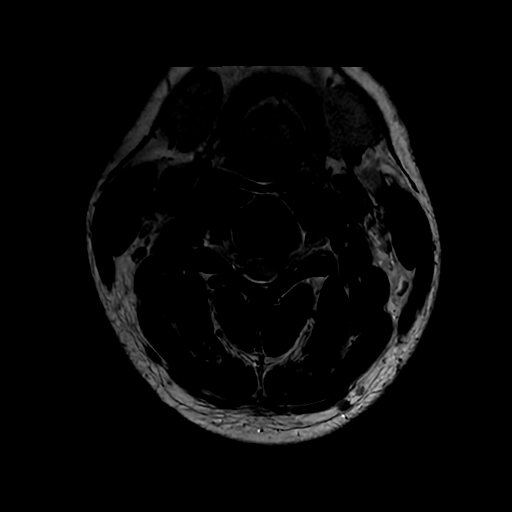
[im 38/54]
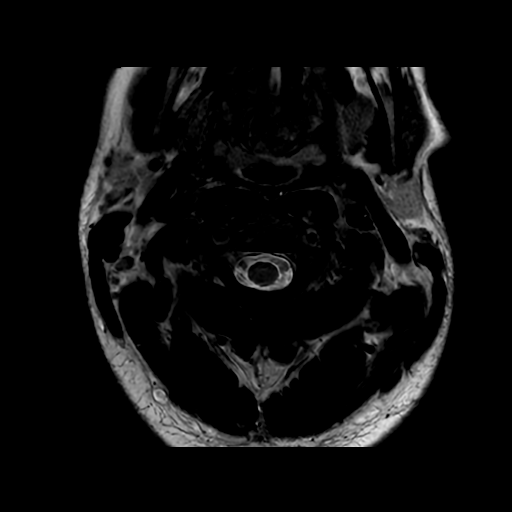
[im 46/54]
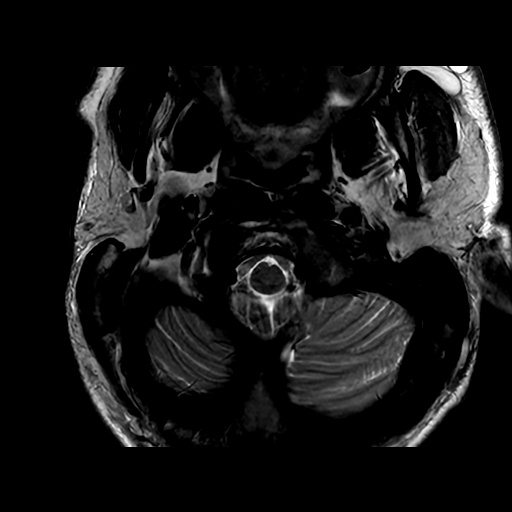
[im 54/54]
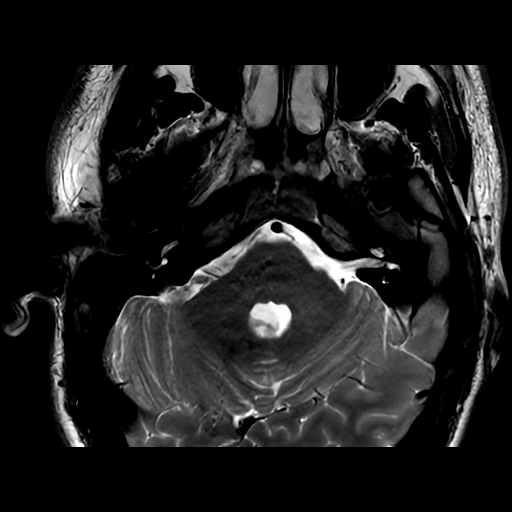

[Series 8: T1 · axial · non-contrast · 3.0mm · 0.35mm/px · z∈[-255,-111]mm · 6 of 54 slices shown]
[im 1/54]
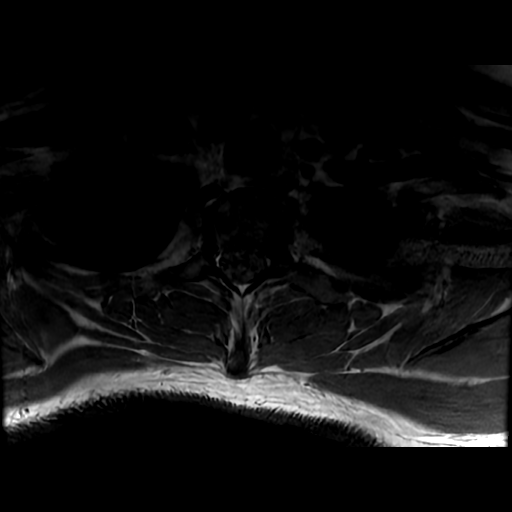
[im 8/54]
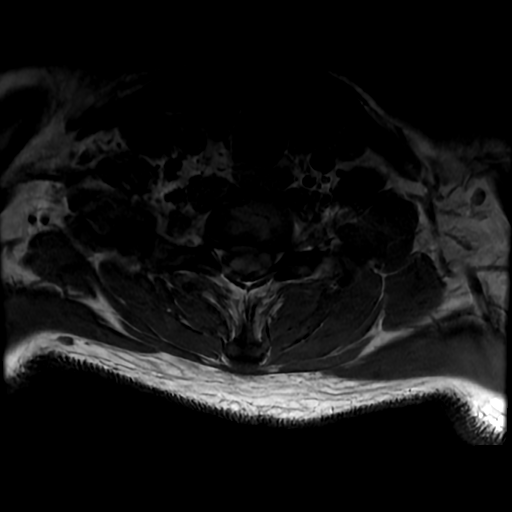
[im 16/54]
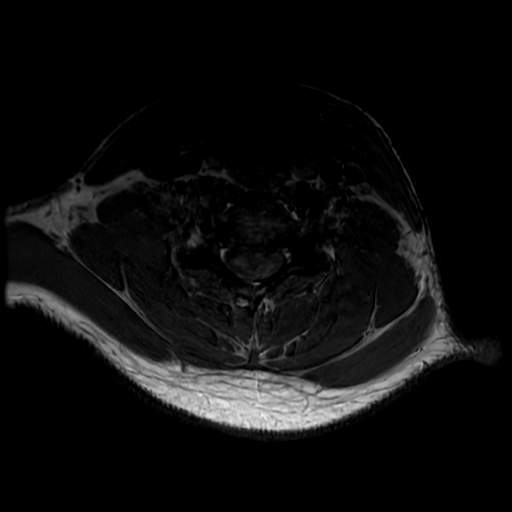
[im 23/54]
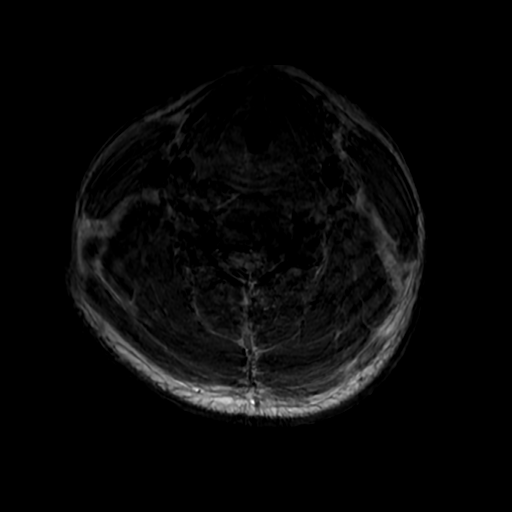
[im 31/54]
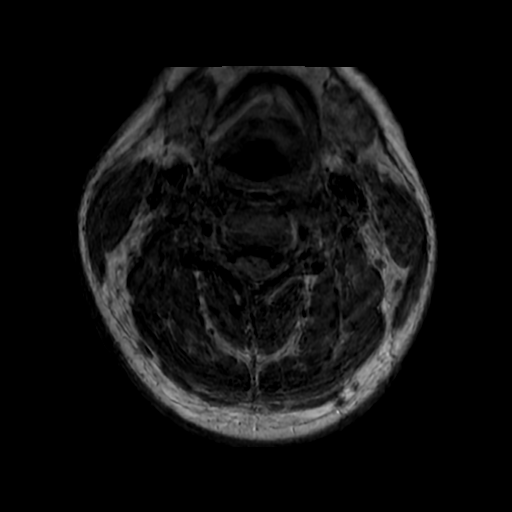
[im 46/54]
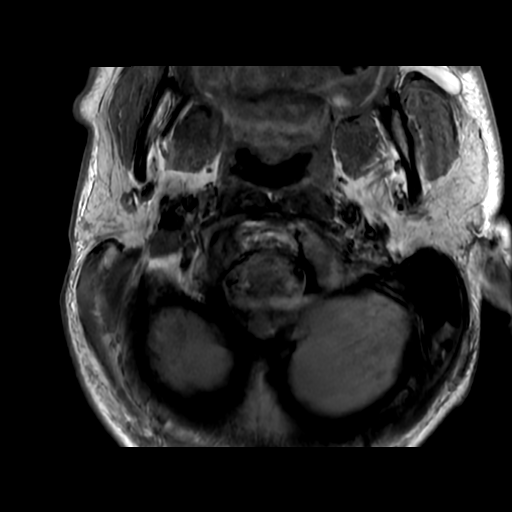

[19 of 48 positions shown; findings below may reference images not displayed]

FINDINGS: Alignment: Examination degraded by motion.

Straightening of the normal cervical lordosis.  No listhesis.

Vertebrae: Vertebral body height maintained without acute or chronic
fracture. Bone marrow signal intensity within normal limits. No
discrete or worrisome osseous lesions. No abnormal marrow edema.

Cord: Normal signal and morphology. No findings to suggest
ligamentous injury.

Posterior Fossa, vertebral arteries, paraspinal tissues:
Craniocervical junction normal. Paraspinous and prevertebral soft
tissues demonstrate no acute finding. Normal flow voids seen within
the vertebral arteries bilaterally.

Disc levels:

No significant disc pathology seen within the cervical spine.
Intervertebral discs are well hydrated with preserved disc height.
No disc bulge or focal disc herniation. No significant canal or
foraminal stenosis. No neural impingement.
IMPRESSION: 1. Straightening of the normal cervical lordosis, which could be
related to positioning or muscular spasm.
2. Otherwise unremarkable and normal MRI of the cervical spine. No
MRI evidence for acute traumatic injury. No significant disc
pathology, stenosis, or neural impingement.

## 2020-10-01 IMAGING — MR MR MRA HEAD W/O CM
2 series · 17 of 48 positions shown · IV contrast (gadavist)
Comparison: CT from [DATE].

CLINICAL DATA: Initial evaluation for right-sided numbness and
weakness.

EXAM:
MRI HEAD WITHOUT CONTRAST
MRA HEAD WITHOUT CONTRAST
MRA NECK WITHOUT AND WITH CONTRAST
TECHNIQUE: Multiplanar, multi-echo pulse sequences of the brain and surrounding
structures were acquired without intravenous contrast. Angiographic
images of the Circle of Willis were acquired using MRA technique
without intravenous contrast. Angiographic images of the neck were
acquired using MRA technique without and with intravenous contrast.
Carotid stenosis measurements (when applicable) are obtained
utilizing NASCET criteria, using the distal internal carotid
diameter as the denominator.
CONTRAST:  7mL GADAVIST GADOBUTROL 1 MMOL/ML IV SOLN

[Series 2: ax (id) · axial · 1.0mm · 0.43mm/px · z∈[-63,+20]mm · 16 of 176 slices shown]
[im 1/176]
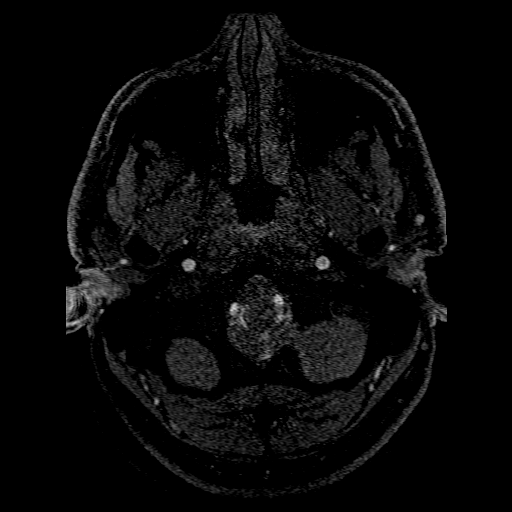
[im 4/176]
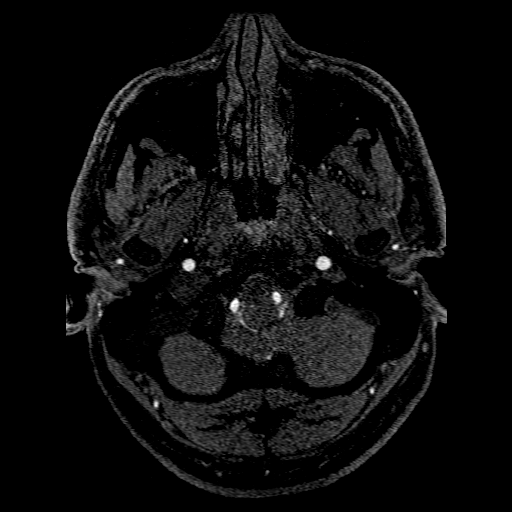
[im 8/176]
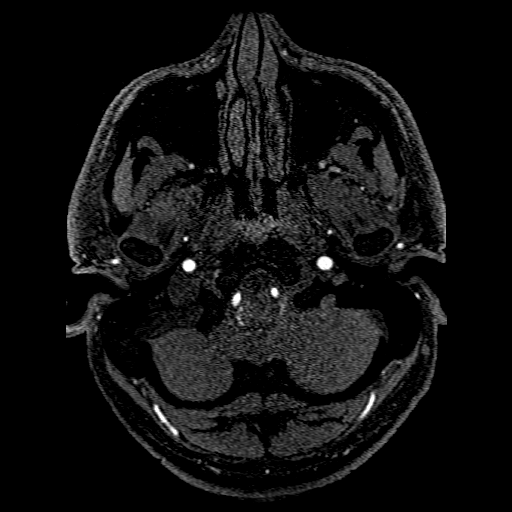
[im 12/176]
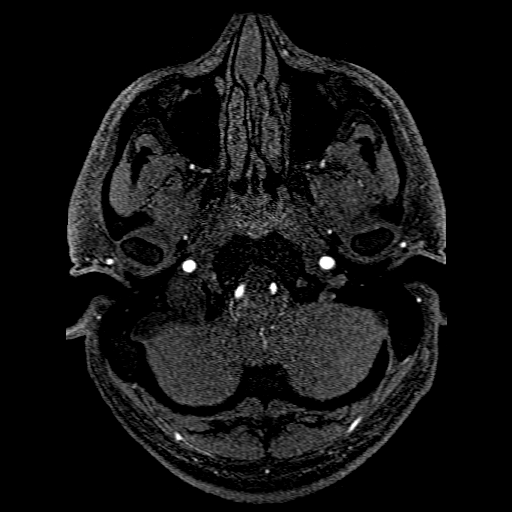
[im 16/176]
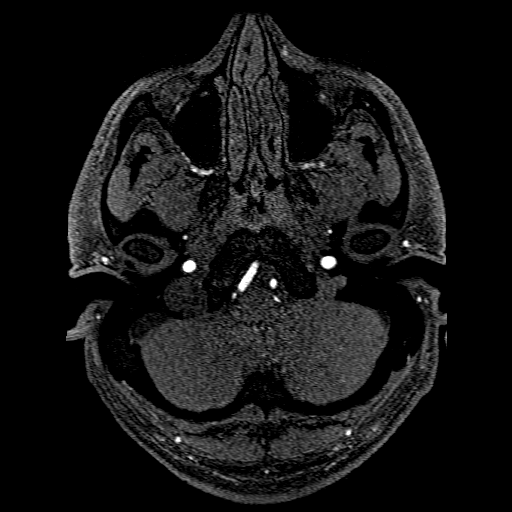
[im 20/176]
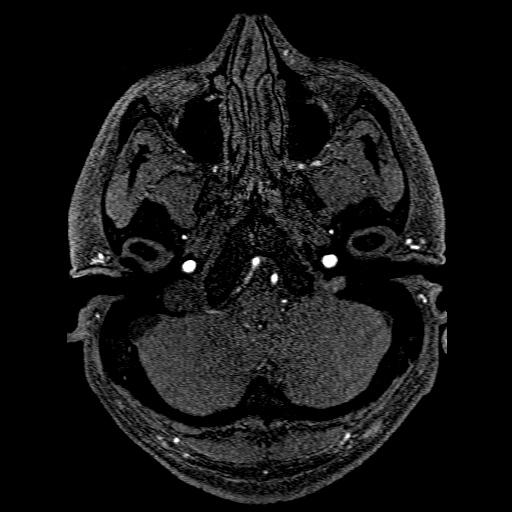
[im 27/176]
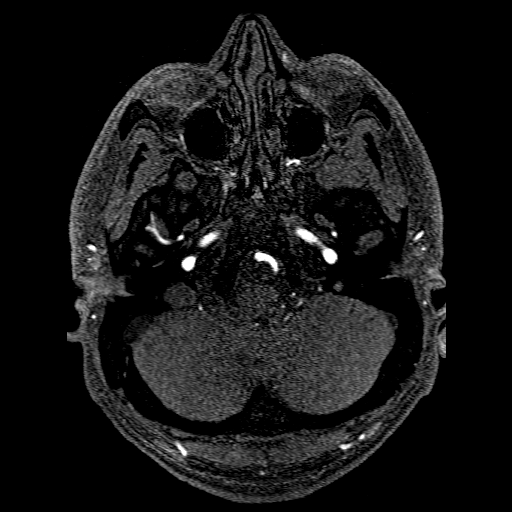
[im 31/176]
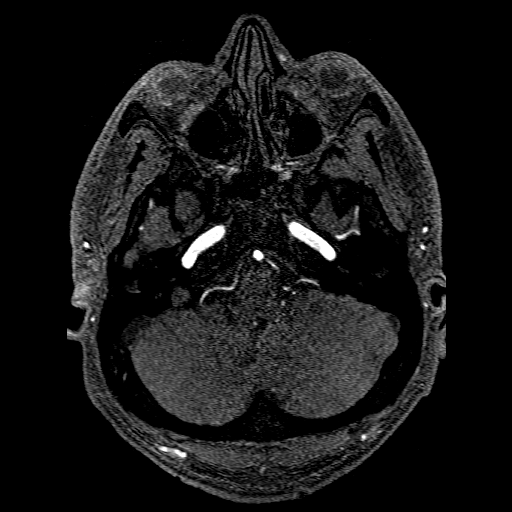
[im 54/176]
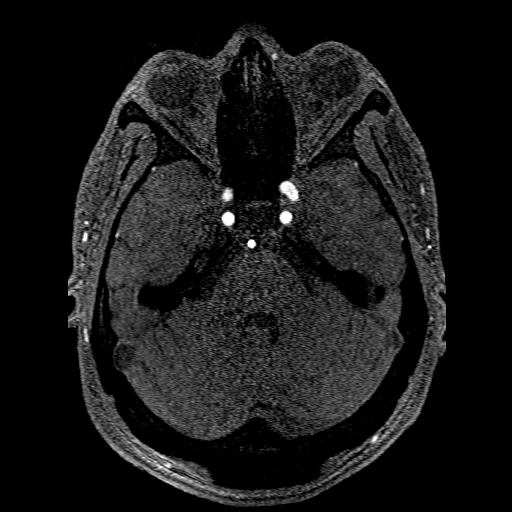
[im 77/176]
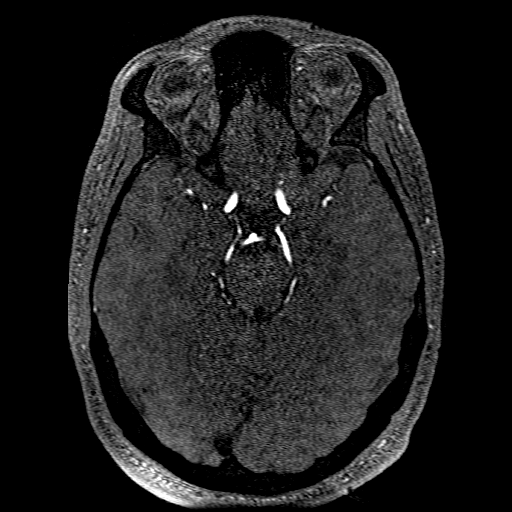
[im 88/176]
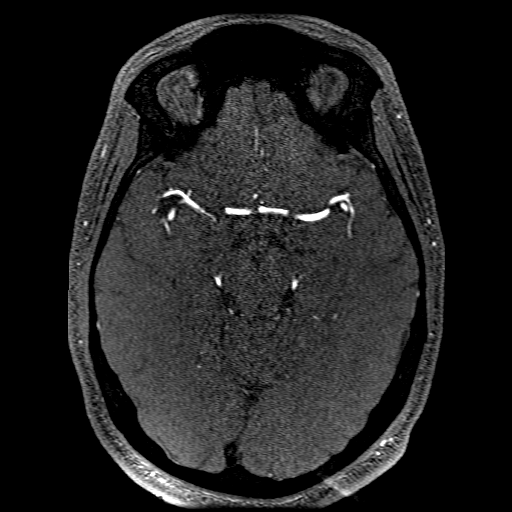
[im 99/176]
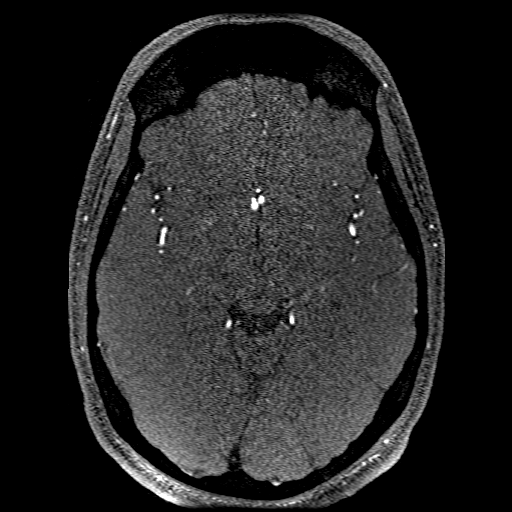
[im 122/176]
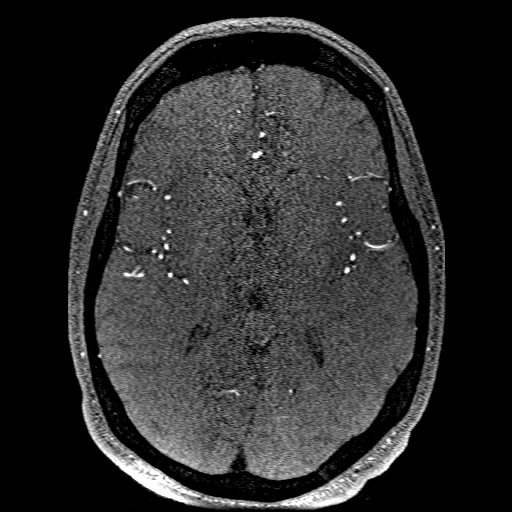
[im 145/176]
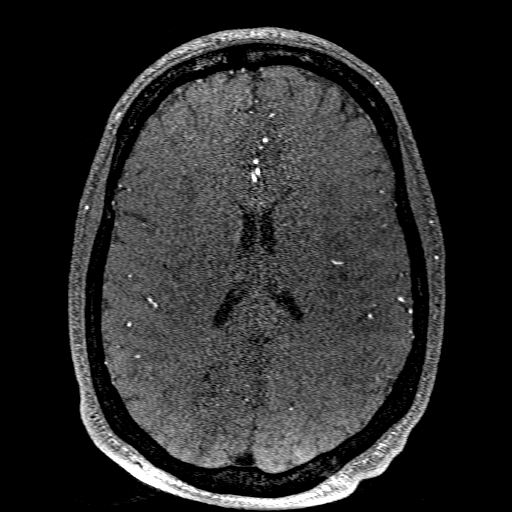
[im 149/176]
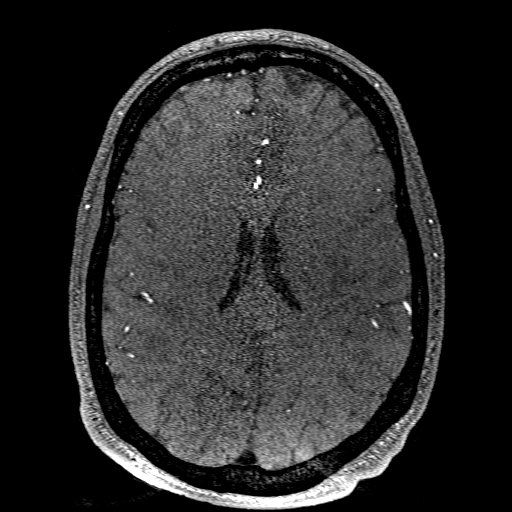
[im 168/176]
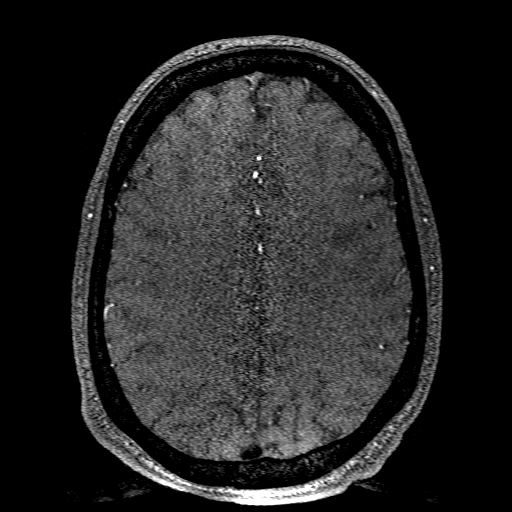

[Series 201: pjn:ax (id) · sagittal · 1.0mm · 0.43mm/px · 1 of 4 slices shown]
[im 1/4]
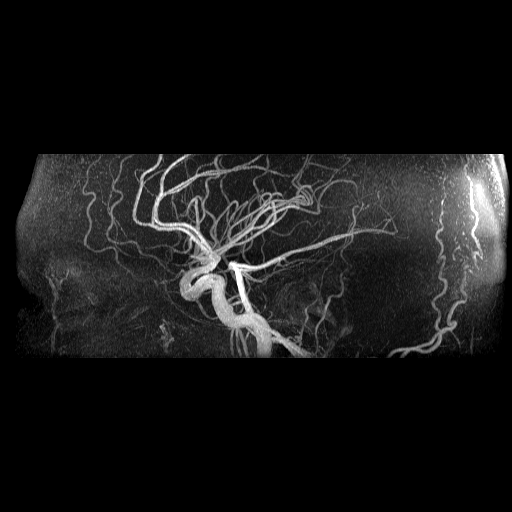

[17 of 48 positions shown; findings below may reference images not displayed]

FINDINGS: MRI HEAD FINDINGS

Brain: Cerebral volume within normal limits for patient age. No
focal parenchymal signal abnormality identified.

No abnormal foci of restricted diffusion to suggest acute or
subacute ischemia. Gray-white matter differentiation well
maintained. No encephalomalacia to suggest chronic infarction. No
foci of susceptibility artifact to suggest acute or chronic
intracranial hemorrhage.

No mass lesion, midline shift or mass effect. No hydrocephalus. No
extra-axial fluid collection.

Pituitary gland and suprasellar region are normal. Midline
structures intact and normal.

Vascular: Major intracranial vascular flow voids well maintained and
normal in appearance.

Skull and upper cervical spine: Craniocervical junction normal.
Visualized upper cervical spine within normal limits. Bone marrow
signal intensity normal. No scalp soft tissue abnormality.

Sinuses/Orbits: Globes and orbital soft tissues within normal
limits.

Mild scattered mucosal thickening noted within the ethmoidal air
cells. Paranasal sinuses are otherwise largely clear. Trace right
mastoid effusion, of doubtful significance. Inner ear structures
grossly normal.

Other: None.

MRA HEAD FINDINGS

Anterior circulation: Both internal carotid arteries widely patent
to the termini without stenosis. A1 segments widely patent. Normal
anterior communicating artery complex. Both anterior cerebral
arteries widely patent to their distal aspects without stenosis. No
M1 stenosis or occlusion. Normal MCA bifurcations. Distal MCA
branches well perfused and symmetric.

Posterior circulation: Both V4 segments patent to the
vertebrobasilar junction without stenosis. Both PICA origins patent
and normal. Basilar widely patent to its distal aspect without
stenosis. Superior cerebellar arteries patent bilaterally. Both PCAs
primarily supplied via the basilar and are well perfused to there
distal aspects.

Anatomic variants: None significant.  No aneurysm.

MRA NECK FINDINGS

Aortic arch: Visualized aortic arch normal in caliber with normal
branch pattern. No stenosis or other abnormality about the origin of
the great vessels.

Right carotid system: Right common and internal carotid arteries
widely patent without stenosis, evidence for dissection or
occlusion.

Left carotid system: Left common and internal carotid arteries
widely patent without stenosis, evidence for dissection or
occlusion.

Vertebral arteries: Left vertebral artery arises directly from the
aortic arch. Vertebral arteries patent without stenosis, evidence
for dissection or occlusion.

Other: None
IMPRESSION: 1. Normal brain MRI. No acute intracranial abnormality.
2. Normal intracranial MRA.
3. Normal MRA of the neck.

## 2020-10-01 IMAGING — MR MR MRA NECK WO/W CM
4 of 7 series · 17 of 48 positions shown · IV contrast (Yes GAD)
Comparison: CT from [DATE].

CLINICAL DATA: Initial evaluation for right-sided numbness and
weakness.

EXAM:
MRI HEAD WITHOUT CONTRAST
MRA HEAD WITHOUT CONTRAST
MRA NECK WITHOUT AND WITH CONTRAST
TECHNIQUE: Multiplanar, multi-echo pulse sequences of the brain and surrounding
structures were acquired without intravenous contrast. Angiographic
images of the Circle of Willis were acquired using MRA technique
without intravenous contrast. Angiographic images of the neck were
acquired using MRA technique without and with intravenous contrast.
Carotid stenosis measurements (when applicable) are obtained
utilizing NASCET criteria, using the distal internal carotid
diameter as the denominator.
CONTRAST:  7mL GADAVIST GADOBUTROL 1 MMOL/ML IV SOLN

[Series 400: cor cemra ft · coronal · 1.2mm · 0.59mm/px · 7 of 105 slices shown]
[im 1/105]
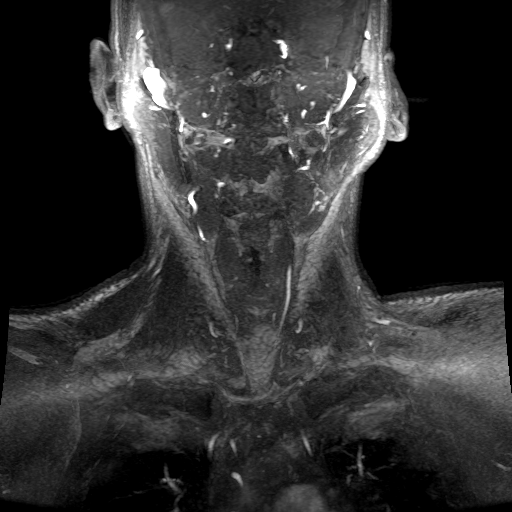
[im 18/105]
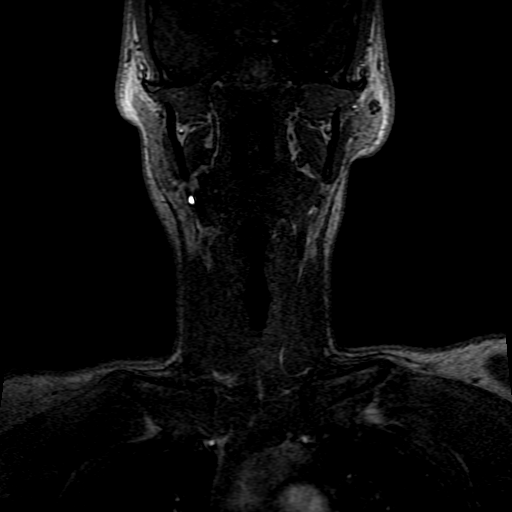
[im 35/105]
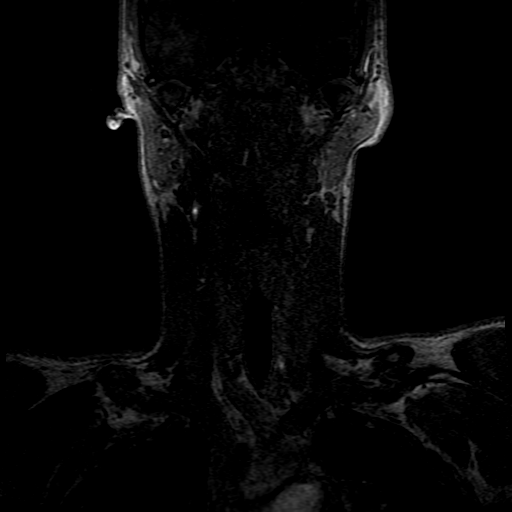
[im 53/105]
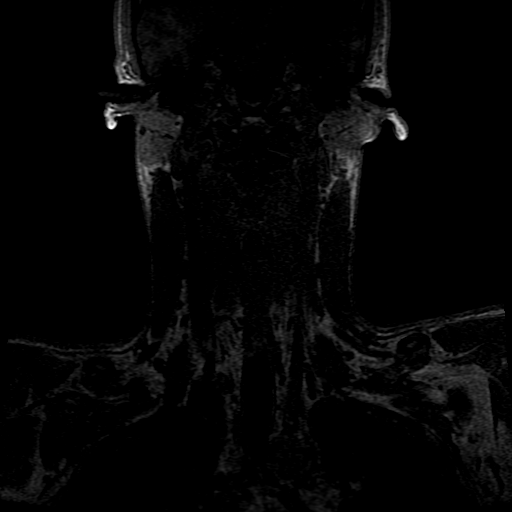
[im 70/105]
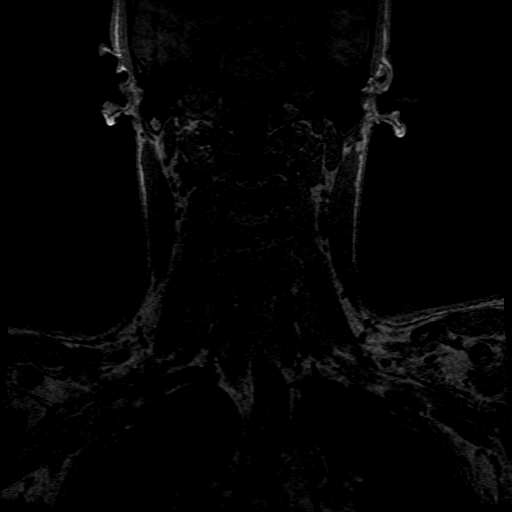
[im 87/105]
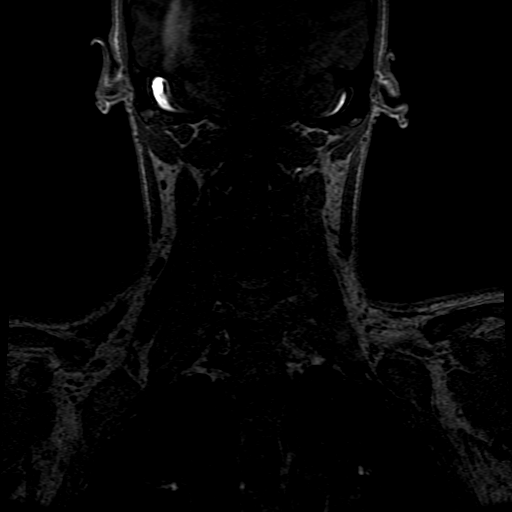
[im 105/105]
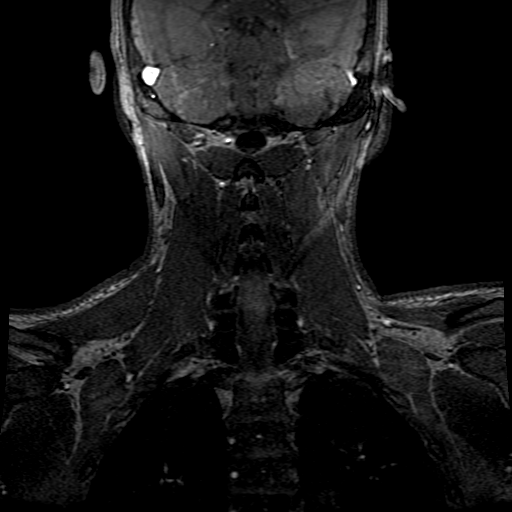

[Series 401: ph1/cor cemra ft · coronal · 1.2mm · 0.59mm/px · 4 of 104 slices shown]
[im 1/104]
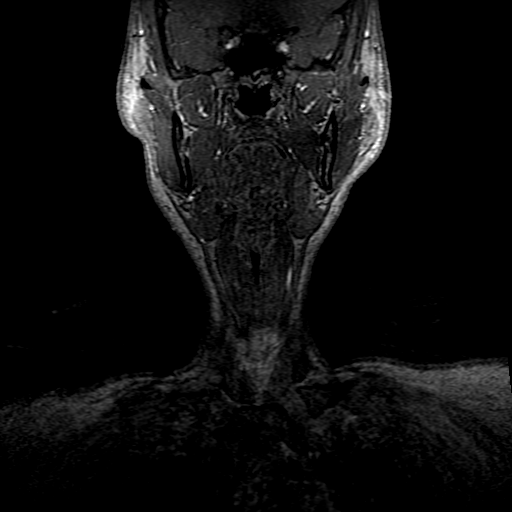
[im 18/104]
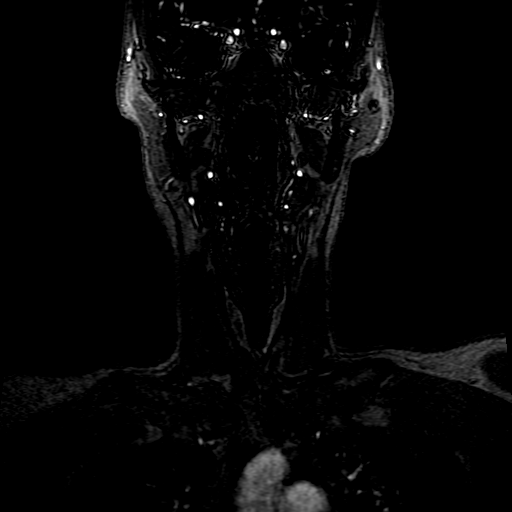
[im 52/104]
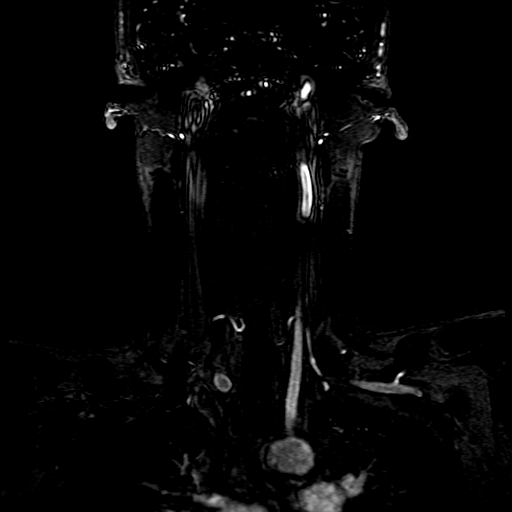
[im 86/104]
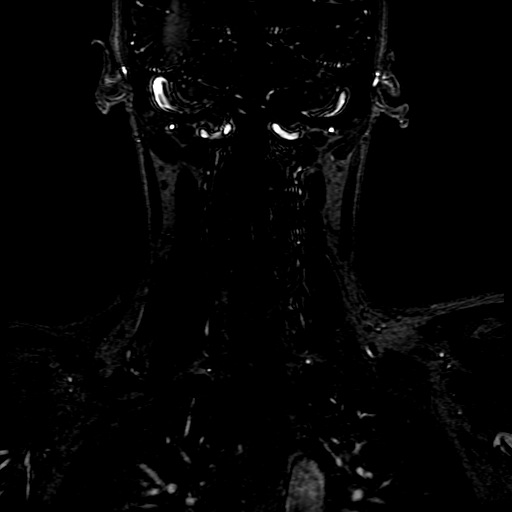

[Series 402: ph2/cor cemra ft · coronal · 1.2mm · 0.59mm/px · 3 of 104 slices shown]
[im 18/104]
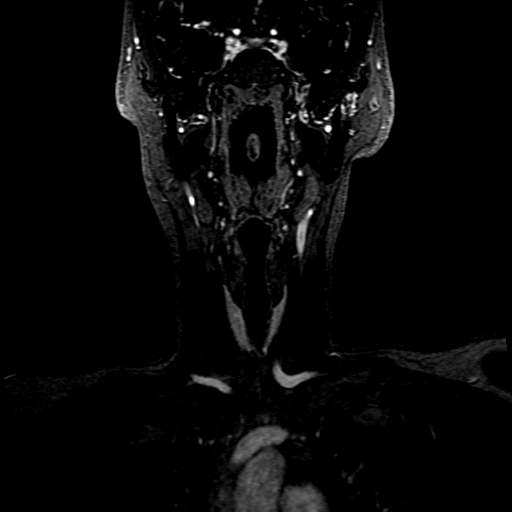
[im 52/104]
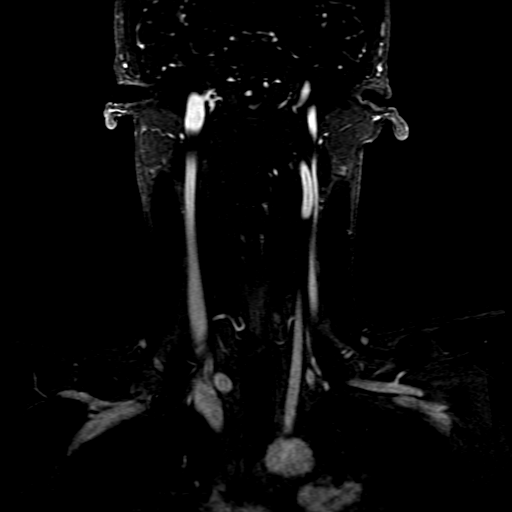
[im 86/104]
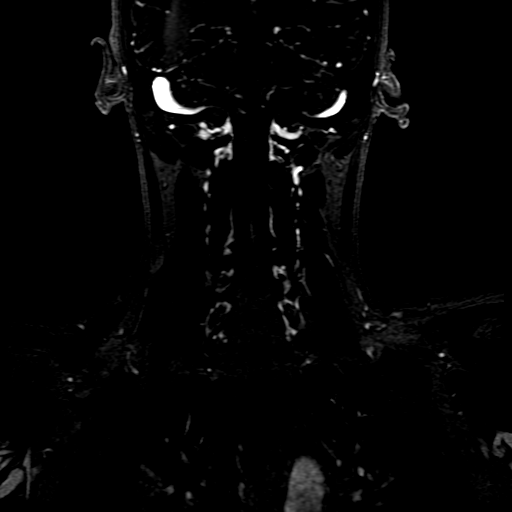

[((date))-((date)) · coronal · 1.2mm · 0.59mm/px · 3 of 105 slices shown]
[im 18/105]
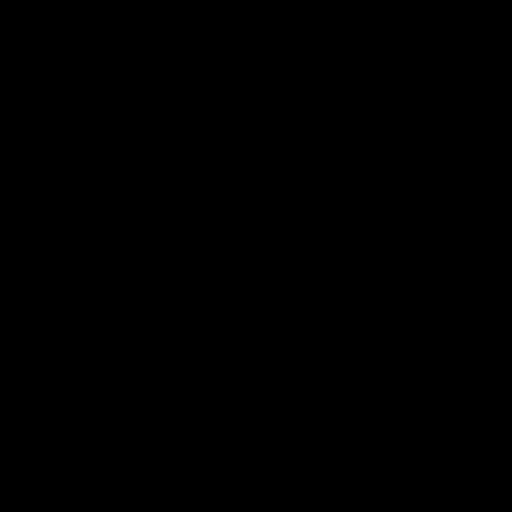
[im 53/105]
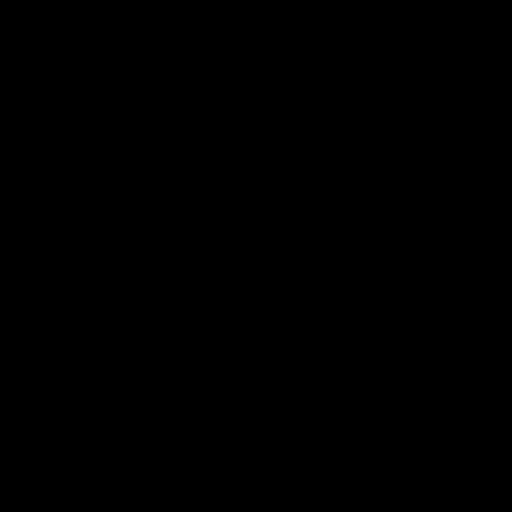
[im 87/105]
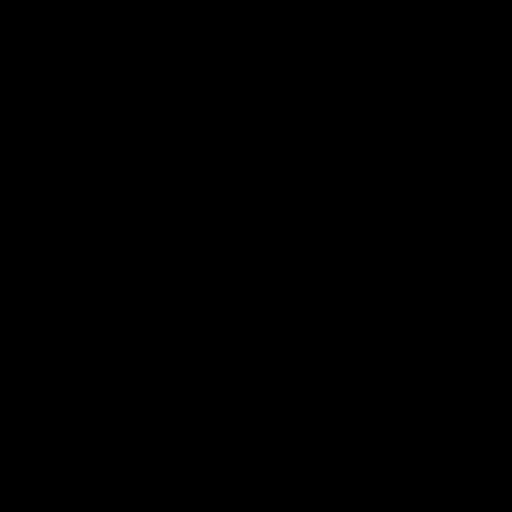

[17 of 48 positions shown; findings below may reference images not displayed]

FINDINGS: MRI HEAD FINDINGS

Brain: Cerebral volume within normal limits for patient age. No
focal parenchymal signal abnormality identified.

No abnormal foci of restricted diffusion to suggest acute or
subacute ischemia. Gray-white matter differentiation well
maintained. No encephalomalacia to suggest chronic infarction. No
foci of susceptibility artifact to suggest acute or chronic
intracranial hemorrhage.

No mass lesion, midline shift or mass effect. No hydrocephalus. No
extra-axial fluid collection.

Pituitary gland and suprasellar region are normal. Midline
structures intact and normal.

Vascular: Major intracranial vascular flow voids well maintained and
normal in appearance.

Skull and upper cervical spine: Craniocervical junction normal.
Visualized upper cervical spine within normal limits. Bone marrow
signal intensity normal. No scalp soft tissue abnormality.

Sinuses/Orbits: Globes and orbital soft tissues within normal
limits.

Mild scattered mucosal thickening noted within the ethmoidal air
cells. Paranasal sinuses are otherwise largely clear. Trace right
mastoid effusion, of doubtful significance. Inner ear structures
grossly normal.

Other: None.

MRA HEAD FINDINGS

Anterior circulation: Both internal carotid arteries widely patent
to the termini without stenosis. A1 segments widely patent. Normal
anterior communicating artery complex. Both anterior cerebral
arteries widely patent to their distal aspects without stenosis. No
M1 stenosis or occlusion. Normal MCA bifurcations. Distal MCA
branches well perfused and symmetric.

Posterior circulation: Both V4 segments patent to the
vertebrobasilar junction without stenosis. Both PICA origins patent
and normal. Basilar widely patent to its distal aspect without
stenosis. Superior cerebellar arteries patent bilaterally. Both PCAs
primarily supplied via the basilar and are well perfused to there
distal aspects.

Anatomic variants: None significant.  No aneurysm.

MRA NECK FINDINGS

Aortic arch: Visualized aortic arch normal in caliber with normal
branch pattern. No stenosis or other abnormality about the origin of
the great vessels.

Right carotid system: Right common and internal carotid arteries
widely patent without stenosis, evidence for dissection or
occlusion.

Left carotid system: Left common and internal carotid arteries
widely patent without stenosis, evidence for dissection or
occlusion.

Vertebral arteries: Left vertebral artery arises directly from the
aortic arch. Vertebral arteries patent without stenosis, evidence
for dissection or occlusion.

Other: None
IMPRESSION: 1. Normal brain MRI. No acute intracranial abnormality.
2. Normal intracranial MRA.
3. Normal MRA of the neck.

## 2020-10-01 MED ORDER — LEVETIRACETAM 500 MG PO TABS: 500.0000 mg | ORAL_TABLET | Freq: Two times a day (BID) | ORAL | 2 refills | Status: DC

## 2020-10-01 MED ORDER — GADOBUTROL 1 MMOL/ML IV SOLN
7.0000 mL | Freq: Once | INTRAVENOUS | Status: AC | PRN
  Administered 2020-10-01: 7 mL via INTRAVENOUS

## 2020-10-01 MED ORDER — LEVETIRACETAM IN NACL 1500 MG/100ML IV SOLN
1500.0000 mg | Freq: Two times a day (BID) | INTRAVENOUS | Status: DC
  Administered 2020-10-01: 1500 mg via INTRAVENOUS
  Filled 2020-10-01: qty 100

## 2020-10-01 NOTE — ED Notes (Signed)
Thed pt returned from mri laughing and talking and moving all extremities

## 2020-10-01 NOTE — ED Notes (Signed)
The pt is talking normally to his mother

## 2020-10-01 NOTE — ED Provider Notes (Signed)
Patient signed out to me by Dr. Rush Landmark to follow-up.  Patient brought to the ER with right-sided weakness after a fall.  There was concern for possible spinal injury initially.  His work-up, however, has been completely normal.  This includes CT of cervical spine as well as MRI brain, MRI cervical spine and MRA of neck.  Patient is now back to baseline.  Mother reports that he did have a febrile seizure as a child and then has had periodic episodes of confusion following syncope that were thought to possibly seizures.  A firm diagnosis of seizure has never been made, so he has not been treated as seizure disorder.  Mother reports that when she got to him he seemed very confused, eyes were open but he did not seem to be seeing her or comprehending what was going on around him.  It does seem that there was a period of confusion at arrival that would coincide with likely postictal state.  Patient now is awake, alert, oriented.  He reports that his right arm and right leg feel like they are "tight" but sensation has returned and he has normal motor function currently.  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  As his work-up has been negative and his neurologic exam is returned to baseline, he does not require hospitalization.  On-call neurology recommends initiating Keppra, outpatient follow-up.    Gilda Crease, MD 10/01/20 [phone]

## 2020-10-01 NOTE — ED Notes (Signed)
The pt returned from mri 

## 2020-10-01 NOTE — ED Notes (Signed)
The pts mother is in the room waiting for her son to come back from [facility]

## 2020-10-01 NOTE — Discharge Instructions (Signed)
You need to follow-up with your neurologist for further testing.  Start the new medication to help prevent further seizures.  Do not drive until you are released by your neurologist.

## 2021-10-03 ENCOUNTER — Emergency Department (HOSPITAL_COMMUNITY): Payer: BC Managed Care – PPO

## 2021-10-03 ENCOUNTER — Emergency Department (HOSPITAL_COMMUNITY)
Admission: EM | Admit: 2021-10-03 | Discharge: 2021-10-03 | Disposition: A | Discharge status: Home / Self Care | Payer: BC Managed Care – PPO | Attending: Emergency Medicine | Admitting: Emergency Medicine

## 2021-10-03 ENCOUNTER — Encounter (HOSPITAL_COMMUNITY): Payer: Self-pay | Admitting: Emergency Medicine

## 2021-10-03 DIAGNOSIS — R112 Nausea with vomiting, unspecified: Secondary | ICD-10-CM | POA: Insufficient documentation

## 2021-10-03 DIAGNOSIS — R1013 Epigastric pain: Secondary | ICD-10-CM | POA: Diagnosis present

## 2021-10-03 DIAGNOSIS — Z9049 Acquired absence of other specified parts of digestive tract: Secondary | ICD-10-CM | POA: Diagnosis not present

## 2021-10-03 DIAGNOSIS — K219 Gastro-esophageal reflux disease without esophagitis: Secondary | ICD-10-CM

## 2021-10-03 HISTORY — DX: Unspecified convulsions: R56.9

## 2021-10-03 LAB — CBC WITH DIFFERENTIAL/PLATELET
Abs Immature Granulocytes: 0.07 10*3/uL (ref 0.00–0.07)
Basophils Absolute: 0.1 10*3/uL (ref 0.0–0.1)
Basophils Relative: 1 %
Eosinophils Absolute: 0.1 10*3/uL (ref 0.0–0.5)
Eosinophils Relative: 1 %
HCT: 49.4 % (ref 39.0–52.0)
Hemoglobin: 17 g/dL (ref 13.0–17.0)
Immature Granulocytes: 1 %
Lymphocytes Relative: 11 %
Lymphs Abs: 1.6 10*3/uL (ref 0.7–4.0)
MCH: 30.3 pg (ref 26.0–34.0)
MCHC: 34.4 g/dL (ref 30.0–36.0)
MCV: 88.1 fL (ref 80.0–100.0)
Monocytes Absolute: 1.1 10*3/uL — ABNORMAL HIGH (ref 0.1–1.0)
Monocytes Relative: 8 %
Neutro Abs: 10.8 10*3/uL — ABNORMAL HIGH (ref 1.7–7.7)
Neutrophils Relative %: 78 %
Platelets: 271 10*3/uL (ref 150–400)
RBC: 5.61 MIL/uL (ref 4.22–5.81)
RDW: 12.3 % (ref 11.5–15.5)
WBC: 13.6 10*3/uL — ABNORMAL HIGH (ref 4.0–10.5)
nRBC: 0 % (ref 0.0–0.2)

## 2021-10-03 LAB — COMPREHENSIVE METABOLIC PANEL
ALT: 42 U/L (ref 0–44)
AST: 31 U/L (ref 15–41)
Albumin: 4.7 g/dL (ref 3.5–5.0)
Alkaline Phosphatase: 66 U/L (ref 38–126)
Anion gap: 13 (ref 5–15)
BUN: 16 mg/dL (ref 6–20)
CO2: 23 mmol/L (ref 22–32)
Calcium: 9.8 mg/dL (ref 8.9–10.3)
Chloride: 101 mmol/L (ref 98–111)
Creatinine, Ser: 1.16 mg/dL (ref 0.61–1.24)
GFR, Estimated: >60 mL/min (ref >60)
Glucose, Bld: 99 mg/dL (ref 70–99)
Potassium: 3.5 mmol/L (ref 3.5–5.1)
Sodium: 137 mmol/L (ref 135–145)
Total Bilirubin: 1.2 mg/dL (ref 0.3–1.2)
Total Protein: 7.6 g/dL (ref 6.5–8.1)

## 2021-10-03 LAB — URINALYSIS, ROUTINE W REFLEX MICROSCOPIC
Bilirubin Urine: NEGATIVE
Glucose, UA: NEGATIVE mg/dL
Ketones, ur: 80 mg/dL — AB
Nitrite: NEGATIVE
Protein, ur: 30 mg/dL — AB
Specific Gravity, Urine: 1.031 — ABNORMAL HIGH (ref 1.005–1.030)
pH: 5 (ref 5.0–8.0)

## 2021-10-03 LAB — LIPASE, BLOOD: Lipase: 24 U/L (ref 11–51)

## 2021-10-03 MED ORDER — ONDANSETRON 4 MG PO TBDP
8.0000 mg | ORAL_TABLET | Freq: Once | ORAL | Status: AC
  Administered 2021-10-03: 8 mg via ORAL
  Filled 2021-10-03: qty 2

## 2021-10-03 MED ORDER — FAMOTIDINE IN NACL 20-0.9 MG/50ML-% IV SOLN
20.0000 mg | Freq: Once | INTRAVENOUS | Status: AC
  Administered 2021-10-03: 20 mg via INTRAVENOUS
  Filled 2021-10-03: qty 50

## 2021-10-03 MED ORDER — LACTATED RINGERS IV BOLUS
1000.0000 mL | Freq: Once | INTRAVENOUS | Status: AC
  Administered 2021-10-03: 1000 mL via INTRAVENOUS

## 2021-10-03 MED ORDER — ONDANSETRON 4 MG PO TBDP: 4.0000 mg | ORAL_TABLET | Freq: Three times a day (TID) | ORAL | 0 refills | Status: AC | PRN

## 2021-10-03 MED ORDER — IOHEXOL 350 MG/ML SOLN
75.0000 mL | Freq: Once | INTRAVENOUS | Status: AC | PRN
  Administered 2021-10-03: 75 mL via INTRAVENOUS

## 2021-10-03 MED ORDER — MORPHINE SULFATE (PF) 4 MG/ML IV SOLN
4.0000 mg | Freq: Once | INTRAVENOUS | Status: AC
  Administered 2021-10-03: 4 mg via INTRAVENOUS
  Filled 2021-10-03: qty 1

## 2021-10-03 MED ORDER — OMEPRAZOLE 20 MG PO CPDR: 20.0000 mg | DELAYED_RELEASE_CAPSULE | Freq: Every day | ORAL | 2 refills | Status: DC

## 2021-10-03 MED ORDER — FAMOTIDINE 20 MG PO TABS: 20.0000 mg | ORAL_TABLET | Freq: Two times a day (BID) | ORAL | 0 refills | Status: DC

## 2021-10-03 NOTE — Discharge Instructions (Signed)
Please avoid very spicy, greasy foods, and also hot beverages and foods.  These are all irritating the stomach.  Alcohol and smoking can also be irritating to the stomach.

## 2021-10-03 NOTE — ED Provider Notes (Signed)
[facility] EMERGENCY DEPARTMENT Provider Note   CSN: [id number] Arrival date & time: 10/03/21  1501     History Chief Complaint  Patient presents with   Abdominal Pain    Anthony Guerra is a 26 y.o. male with history of seizures presents to the emergency room for evaluation of epigastric pain that radiates to his diffuse abdomen.  He describes it as stabbing in nature.  He reports some nausea and vomiting, 2 episodes yesterday but 7 episodes today.  He reports his last 2 episodes appear to have some coffee-ground emesis however no frank blood.  He denies any diarrhea or constipation however he has had 3 episodes of bright red blood bowel movement without stool.  Denies any fever, dysuria, hematuria, chest pain, or shortness of breath.  Patient reports that he has had hematochezia before however no one was able to describe to him why he was having this.  He has been out of his seizure medication for the past 2 weeks.  History of cholecystectomy and tonsil and adenoidectomy.   Abdominal Pain Associated symptoms: nausea and vomiting   Associated symptoms: no chest pain, no chills, no constipation, no diarrhea, no dysuria, no fever, no hematuria and no shortness of breath        Home Medications Prior to Admission medications   Medication Sig Start Date End Date Taking? Authorizing Provider  lamoTRIgine (LAMICTAL) 100 MG tablet Take 100 mg by mouth 2 (two) times daily. 08/01/21  Yes [provider]  loratadine (CLARITIN) 10 MG tablet Take 10 mg by mouth daily.   Yes [provider]  levETIRAcetam (KEPPRA) 500 MG tablet Take 1 tablet (500 mg total) by mouth 2 (two) times daily. Patient not taking: Reported on 10/03/2021 10/01/20   Gilda Crease, MD      Allergies    Levetiracetam    Review of Systems   Review of Systems  Constitutional:  Negative for chills and fever.  HENT:  Negative for congestion and rhinorrhea.   Respiratory:   Negative for shortness of breath.   Cardiovascular:  Negative for chest pain.  Gastrointestinal:  Positive for abdominal pain, blood in stool, nausea and vomiting. Negative for constipation and diarrhea.  Genitourinary:  Negative for dysuria and hematuria.  Musculoskeletal:  Negative for joint swelling.    Physical Exam Updated Vital Signs BP 128/88   Pulse (!) 59   Temp 98 F (36.7 C) (Oral)   Resp 19   SpO2 95%  Physical Exam Vitals and nursing note reviewed.  Constitutional:      General: He is not in acute distress.    Appearance: Normal appearance. He is not ill-appearing or toxic-appearing.  HENT:     Head: Normocephalic and atraumatic.  Eyes:     General: No scleral icterus. Cardiovascular:     Rate and Rhythm: Normal rate and regular rhythm.  Pulmonary:     Effort: Pulmonary effort is normal. No respiratory distress.     Breath sounds: Normal breath sounds.  Abdominal:     General: Bowel sounds are normal. There is no distension.     Palpations: Abdomen is soft.     Tenderness: There is generalized abdominal tenderness and tenderness in the epigastric area. There is no guarding or rebound.     Comments: Abdomen is soft, nondistended.  He has diffuse abdominal tenderness, mainly in the epigastric region.  Normal active bowel sounds.  No overlying skin changes noted.  No peritoneal signs.  Musculoskeletal:  General: No deformity.     Cervical back: Normal range of motion.  Skin:    General: Skin is warm and dry.  Neurological:     General: No focal deficit present.     Mental Status: He is alert. Mental status is at baseline.     ED Results / Procedures / Treatments   Labs (all labs ordered are listed, but only abnormal results are displayed) Labs Reviewed  CBC WITH DIFFERENTIAL/PLATELET - Abnormal; Notable for the following components:      Result Value   WBC 13.6 (*)    Neutro Abs 10.8 (*)    Monocytes Absolute 1.1 (*)    All other components within  normal limits  URINALYSIS, ROUTINE W REFLEX MICROSCOPIC - Abnormal; Notable for the following components:   APPearance HAZY (*)    Specific Gravity, Urine 1.031 (*)    Hgb urine dipstick SMALL (*)    Ketones, ur 80 (*)    Protein, ur 30 (*)    Leukocytes,Ua TRACE (*)    Bacteria, UA RARE (*)    All other components within normal limits  COMPREHENSIVE METABOLIC PANEL  LIPASE, BLOOD    EKG None  Radiology No results found.  Procedures Procedures   Medications Ordered in ED Medications  ondansetron (ZOFRAN-ODT) disintegrating tablet 8 mg (8 mg Oral Given 10/03/21 1632)  lactated ringers bolus 1,000 mL (1,000 mLs Intravenous New Bag/Given 10/03/21 1745)  morphine (PF) 4 MG/ML injection 4 mg (4 mg Intravenous Given 10/03/21 1749)    ED Course/ Medical Decision Making/ A&P                           Medical Decision Making Amount and/or Complexity of Data Reviewed Radiology: ordered.  Risk Prescription drug management.   26 year old male presents to the Emergency Department for evaluation of epigastric abdominal pain and diffuse abdominal pain with nausea, vomiting, hematochezia for the past 2 days.  Differential diagnosis includes but is limited to viral gastroenteritis, bacterial gastroenteritis, colitis, diverticulitis, PUD, gastritis, Mallory-Weiss tear, esophagitis.  Vital signs show slight bradycardia otherwise unremarkable.  Afebrile.  Physical exam as noted above.  Patient has declined rectal exam.   Labs were ordered in triage.  I added on a CT abdomen given the patient's significant abdominal tenderness that is diffuse, but mainly in the epigastric region.  I independently reviewed and interpreted the patient's labs.  Lipase normal.  CMP shows no electrolyte or LFT abnormality.  CBC shows slight increase in white blood cell count at 13.6 with a left shift.  No anemia.  Normal platelets.  Urinalysis shows hazy concentrated urine with small amount of hemoglobin.  There is  80 ketones, 30 protein with trace amount of leukocytes.  1120 white blood cells seen however rare bacteria.  Ketones likely due to starvation ketosis from patient's lack of p.o. intake.  Will order 1 L of LR.  Zofran ordered from nursing.  We will give 4 mg of morphine for pain.  Pending CT abdomen.  7:07 PM Care of [facility] transferred to Dr. Alvester Chou at the end of my shift as the patient will require reassessment once labs/imaging have resulted. Patient presentation, ED course, and plan of care discussed with review of all pertinent labs and imaging. Please see his/her note for further details regarding further ED course and disposition. Plan at time of handoff is follow up on CT imaging. PO challenge. This may be altered or completely  changed at the discretion of the oncoming team pending results of further workup.  Final Clinical Impression(s) / ED Diagnoses Final diagnoses:  None    Rx / DC Orders ED Discharge Orders     None         Achille Rich, Cordelia Poche 10/03/21 1908    Terald Sleeper, MD 10/03/21 2224

## 2021-10-03 NOTE — ED Triage Notes (Signed)
Patient here with complaint of abdominal pain that started yesterday after eating a burger.

## 2021-10-03 NOTE — ED Notes (Signed)
Patient given water and states he felt fine after drinking.

## 2021-10-03 NOTE — ED Provider Triage Note (Signed)
Emergency Medicine Provider Triage Evaluation Note  Anthony Guerra , a 26 y.o. male  was evaluated in triage.  Pt complains of abdominal pain, onset last night located diffuse- starts in epigastric area and feels like tearing pain all the way down, onset after eating a cheese burger around 9:30pm. With vomiting x 4 episodes, non bloody. Went to [facility] but left after extended wait. Went to the bathroom and had bright red blood in the commode mixed with clots.  Not anticoagulated. Prior cholecystectomy.  Review of Systems  Positive: Abdominal pain, vomiting, rectal bleeding Negative: fever  Physical Exam  There were no vitals taken for this visit. Gen:   Awake, no distress   Resp:  Normal effort  MSK:   Moves extremities without difficulty  Other:    Medical Decision Making  Medically screening exam initiated at 3:48 PM.  Appropriate orders placed.  Anthony Guerra was informed that the remainder of the evaluation will be completed by another provider, this initial triage assessment does not replace that evaluation, and the importance of remaining in the ED until their evaluation is complete.     Anthony Fend, PA-C 10/03/21 1550

## 2021-10-09 ENCOUNTER — Encounter: Payer: Self-pay | Admitting: Physician Assistant

## 2021-11-06 ENCOUNTER — Encounter: Payer: Self-pay | Admitting: Physician Assistant

## 2021-11-06 ENCOUNTER — Ambulatory Visit (INDEPENDENT_AMBULATORY_CARE_PROVIDER_SITE_OTHER): Payer: BC Managed Care – PPO | Admitting: Physician Assistant

## 2021-11-06 DIAGNOSIS — R935 Abnormal findings on diagnostic imaging of other abdominal regions, including retroperitoneum: Secondary | ICD-10-CM | POA: Diagnosis not present

## 2021-11-06 DIAGNOSIS — R1013 Epigastric pain: Secondary | ICD-10-CM | POA: Diagnosis not present

## 2021-11-06 DIAGNOSIS — K219 Gastro-esophageal reflux disease without esophagitis: Secondary | ICD-10-CM

## 2021-11-06 MED ORDER — OMEPRAZOLE 20 MG PO CPDR: 20.0000 mg | DELAYED_RELEASE_CAPSULE | Freq: Every day | ORAL | 5 refills | Status: AC

## 2021-11-06 MED ORDER — FAMOTIDINE 20 MG PO TABS: 20.0000 mg | ORAL_TABLET | Freq: Two times a day (BID) | ORAL | 5 refills | Status: AC

## 2021-11-06 NOTE — Patient Instructions (Addendum)
If you are age 24 or younger, your body mass index should be between 19-25. Your Body mass index is 30.71 kg/m. If this is out of the aformentioned range listed, please consider follow up with your Primary Care Provider.  ________________________________________________________  The [facility] providers would like to encourage you to use [facility] to communicate with providers for non-urgent requests or questions.  Due to long hold times on the telephone, sending your provider a message by [facility] may be a faster and more efficient way to get a response.  Please allow 48 business hours for a response.  Please remember that this is for non-urgent requests.  _______________________________________________________  Anthony Guerra have been scheduled for an endoscopy. Please follow written instructions given to you at your visit today. If you use inhalers (even only as needed), please bring them with you on the day of your procedure.  Due to recent changes in healthcare laws, you may see the results of your imaging and laboratory studies on MyChart before your provider has had a chance to review them.  We understand that in some cases there may be results that are confusing or concerning to you. Not all laboratory results come back in the same time frame and the provider may be waiting for multiple results in order to interpret others.  Please give [place] 48 hours in order for your provider to thoroughly review all the results before contacting the office for clarification of your results.   We have sent the following medications to your pharmacy for you to pick up at your convenience:  Omeprazole 20mg  one capsule daily Pepcid 20mg  one tablet twice daily  Thank you for entrusting me with your care and choosing [facility].  Ellouise Newer, PA-C

## 2021-11-06 NOTE — Progress Notes (Signed)
Chief Complaint: GERD, epigastric pain and abnormal CT of the abdomen  HPI:    Anthony Guerra is a 26 year old male with a past medical history as listed below including seizures and acid reflux, who was referred to me by Wyvonnia Dusky, MD for a complaint of GERD, epigastric pain and abnormal CT of the abdomen.      03/24/2021 followed with neurology.  That time continued on Lamictal 100 mg twice daily.  Told to follow-up in 6 months.    10/03/2021 ED visit for epigastric pain, nausea and vomiting.  His work-up was notable for signs of gastritis on CT imaging-gastric antral wall thickening.  Also mild leukocytosis thought reactive from vomiting as well as ketones in the urine with no AKI.  He was given fluids, morphine, Pepcid and Zofran.  Significant improvement of symptoms.  He was started on Omeprazole and Pepcid.    Today, the patient tells me he has had trouble with heartburn and reflux symptoms ever since he was 10 or 26 years old and has been on and off of medication his entire life.  Most recently was off of medication and about 2 weeks ago or so he developed severe epigastric pain that was worse with eating and would vomit anytime it started.  Rated as a 10/10.  Along with this was having heartburn and reflux on a daily basis.  Since being seen in the ER he has continued on Prilosec 20 mg every morning and Pepcid 20 mg twice daily and his symptoms have completely resolved.  In fact he is even eating some offending foods over the past week or so and has not been bothered by them.  Also tells me he is not having any more nighttime awakenings due to reflux.  He feels great.    Denies fever, chills, weight loss or blood in his stool.  Past Medical History:  Diagnosis Date   Acid reflux    Seizures (HCC)     Past Surgical History:  Procedure Laterality Date   CHOLECYSTECTOMY     TONSILLECTOMY      Current Outpatient Medications  Medication Sig Dispense Refill   lamoTRIgine (LAMICTAL) 100 MG  tablet Take 100 mg by mouth 2 (two) times daily.     omeprazole (PRILOSEC) 20 MG capsule Take 1 capsule (20 mg total) by mouth daily. Take in the morning 30 capsule 2   ondansetron (ZOFRAN-ODT) 4 MG disintegrating tablet Take 1 tablet (4 mg total) by mouth every 8 (eight) hours as needed for up to 15 doses for nausea or vomiting. 15 tablet 0   famotidine (PEPCID) 20 MG tablet Take 1 tablet (20 mg total) by mouth 2 (two) times daily. Take 30 minutes before breakfast and dinner 60 tablet 0   loratadine (CLARITIN) 10 MG tablet Take 10 mg by mouth daily. (Patient not taking: Reported on 11/06/2021)     No current facility-administered medications for this visit.    Allergies as of 11/06/2021 - Review Complete 11/06/2021  Allergen Reaction Noted   Levetiracetam Other (See Comments) 11/24/2020    History reviewed. No pertinent family history.  Social History   Socioeconomic History   Marital status: Single    Spouse name: Not on file   Number of children: Not on file   Years of education: Not on file   Highest education level: Not on file  Occupational History   Not on file  Tobacco Use   Smoking status: Not on file   Smokeless tobacco:  Not on file  Substance and Sexual Activity   Alcohol use: Not on file   Drug use: Not on file   Sexual activity: Not on file  Other Topics Concern   Not on file  Social History Narrative   Not on file   Social Determinants of Health   Financial Resource Strain: Not on file  Food Insecurity: Not on file  Transportation Needs: Not on file  Physical Activity: Not on file  Stress: Not on file  Social Connections: Not on file  Intimate Partner Violence: Not on file    Review of Systems:    Constitutional: No weight loss, fever or chills Skin: No rash Cardiovascular: No chest pain   Respiratory: No SOB Gastrointestinal: See HPI and otherwise negative Genitourinary: No dysuria Neurological: No headache, dizziness or syncope Musculoskeletal:  No new muscle or joint pain Hematologic: No bleeding  Psychiatric: No history of depression or anxiety   Physical Exam:  Vital signs: BP 122/74   Pulse 71   Ht 5\' 6"  (1.676 m)   Wt 190 lb 4 oz (86.3 kg)   BMI 30.71 kg/m    Constitutional:   Pleasant Caucasian male appears to be in NAD, Well developed, Well nourished, alert and cooperative Head:  Normocephalic and atraumatic. Eyes:   PEERL, EOMI. No icterus. Conjunctiva pink. Ears:  Normal auditory acuity. Neck:  Supple Throat: Oral cavity and pharynx without inflammation, swelling or lesion.  Respiratory: Respirations even and unlabored. Lungs clear to auscultation bilaterally.   No wheezes, crackles, or rhonchi.  Cardiovascular: Normal S1, S2. No MRG. Regular rate and rhythm. No peripheral edema, cyanosis or pallor.  Gastrointestinal:  Soft, nondistended, nontender. No rebound or guarding. Normal bowel sounds. No appreciable masses or hepatomegaly. Rectal:  Not performed.  Msk:  Symmetrical without gross deformities. Without edema, no deformity or joint abnormality.  Neurologic:  Alert and  oriented x4;  grossly normal neurologically.  Skin:   Dry and intact without significant lesions or rashes. Psychiatric: Demonstrates good judgement and reason without abnormal affect or behaviors.  RELEVANT LABS AND IMAGING: CBC    Component Value Date/Time   WBC 13.6 (H) 10/03/2021 1556   RBC 5.61 10/03/2021 1556   HGB 17.0 10/03/2021 1556   HCT 49.4 10/03/2021 1556   PLT 271 10/03/2021 1556   MCV 88.1 10/03/2021 1556   MCH 30.3 10/03/2021 1556   MCHC 34.4 10/03/2021 1556   RDW 12.3 10/03/2021 1556   LYMPHSABS 1.6 10/03/2021 1556   MONOABS 1.1 (H) 10/03/2021 1556   EOSABS 0.1 10/03/2021 1556   BASOSABS 0.1 10/03/2021 1556    CMP     Component Value Date/Time   NA 137 10/03/2021 1556   K 3.5 10/03/2021 1556   CL 101 10/03/2021 1556   CO2 23 10/03/2021 1556   GLUCOSE 99 10/03/2021 1556   BUN 16 10/03/2021 1556   CREATININE  1.16 10/03/2021 1556   CALCIUM 9.8 10/03/2021 1556   PROT 7.6 10/03/2021 1556   ALBUMIN 4.7 10/03/2021 1556   AST 31 10/03/2021 1556   ALT 42 10/03/2021 1556   ALKPHOS 66 10/03/2021 1556   BILITOT 1.2 10/03/2021 1556   GFRNONAA >60 10/03/2021 1556    Assessment: 1.  GERD: For his entire life, on and off of PPI, most recently extreme increase in pain with vomiting and daily reflux, better now he is back on Omeprazole and Pepcid, CT with gastritis in the ER; likely gastritis +/- PUD +/- H. pylori 2.  Epigastric pain: See  above 3.  Abnormal CT of the abdomen: See above 4.  Seizure disorder: Controlled on Lamictal 100 mg twice daily, last neurologist visit 03/24/2021  Plan: 1.  Scheduled patient for diagnostic EGD in the [facility] with the first available provider Dr. Tomasa Rand.  Did provide the patient a detailed list of risks for the procedure and he agrees to proceed. Patient is appropriate for endoscopic procedure(s) in the ambulatory ([facility]) setting.  2.  Continue Omeprazole 20 mg every morning, 30 minutes before breakfast #30 with 5 refills. 3.  Continue Pepcid 20 mg twice daily #60 with 5 refills. 4.  Reviewed antireflux diet and lifestyle modifications. 5.  Patient to follow in clinic per recommendations after procedure above.  He likely will require lifelong meds given this has been a problem for him for many years.He will continue to follow with Dr. Tomasa Rand.  Anthony Meeker, PA-C [facility] 11/06/2021, 11:32 AM  Cc: Terald Sleeper, MD

## 2021-11-14 ENCOUNTER — Ambulatory Visit (AMBULATORY_SURGERY_CENTER): Payer: BC Managed Care – PPO | Admitting: Gastroenterology

## 2021-11-14 ENCOUNTER — Encounter: Payer: Self-pay | Admitting: Gastroenterology

## 2021-11-14 VITALS — BP 90/63 | HR 68 | Temp 96.9°F | Resp 15 | Ht 66.0 in | Wt 190.0 lb

## 2021-11-14 DIAGNOSIS — R933 Abnormal findings on diagnostic imaging of other parts of digestive tract: Secondary | ICD-10-CM | POA: Diagnosis not present

## 2021-11-14 DIAGNOSIS — K2970 Gastritis, unspecified, without bleeding: Secondary | ICD-10-CM

## 2021-11-14 DIAGNOSIS — K219 Gastro-esophageal reflux disease without esophagitis: Secondary | ICD-10-CM

## 2021-11-14 MED ORDER — SODIUM CHLORIDE 0.9 % IV SOLN: 500.0000 mL | Freq: Once | INTRAVENOUS | Status: AC

## 2021-11-14 NOTE — Progress Notes (Signed)
VS completed by CW.   Pt's states no medical or surgical changes since previsit or office visit.  

## 2021-11-14 NOTE — Progress Notes (Signed)
History and Physical Interval Note:  11/14/2021 10:55 AM  Anthony Guerra  has presented today for endoscopic procedure(s), with the diagnosis of  Encounter Diagnosis  Name Primary?   Gastroesophageal reflux disease, unspecified whether esophagitis present Yes  .  The various methods of evaluation and treatment have been discussed with the patient and/or family. After consideration of risks, benefits and other options for treatment, the patient has consented to  the endoscopic procedure(s).   The patient's history has been reviewed, patient examined, no change in status, stable for endoscopic procedure(s).  I have reviewed the patient's chart and labs.  Questions were answered to the patient's satisfaction.     [name], MD [facility]

## 2021-11-14 NOTE — Op Note (Signed)
[name] Procedure Date: 11/14/2021 10:53 AM MRN: [id number] Endoscopist: Nicki Reaper E. Candis Schatz , MD Age: 26 Referring MD:  Date of Birth: 08-20-1995 Gender: Male Account #: [id number] Procedure:                Upper GI endoscopy Indications:              Esophageal reflux symptoms that recur despite                            appropriate therapy, Abnormal CT of the GI tract Medicines:                Monitored Anesthesia Care Procedure:                Pre-Anesthesia Assessment:                           - Prior to the procedure, a History and Physical                            was performed, and patient medications and                            allergies were reviewed. The patient's tolerance of                            previous anesthesia was also reviewed. The risks                            and benefits of the procedure and the sedation                            options and risks were discussed with the patient.                            All questions were answered, and informed consent                            was obtained. Prior Anticoagulants: The patient has                            taken no previous anticoagulant or antiplatelet                            agents. ASA Grade Assessment: II - A patient with                            mild systemic disease. After reviewing the risks                            and benefits, the patient was deemed in                            satisfactory condition to undergo the procedure.  After obtaining informed consent, the endoscope was                            passed under direct vision. Throughout the                            procedure, the patient's blood pressure, pulse, and                            oxygen saturations were monitored continuously. The                            Endoscope was introduced through the mouth, and                             advanced to the third part of duodenum. The upper                            GI endoscopy was accomplished without difficulty.                            The patient tolerated the procedure well. Scope In: Scope Out: Findings:                 The examined esophagus was normal.                           Scattered minimal inflammation characterized by                            friability and granularity was found in the gastric                            antrum. Biopsies were taken with a cold forceps for                            Helicobacter pylori testing. Estimated blood loss                            was minimal.                           The examined duodenum was normal.                           Multiple small discrete ulcers/aphthae were found                            in the posterior oropharynx bilaterally. . Complications:            No immediate complications. Estimated Blood Loss:     Estimated blood loss was minimal. Impression:               - Normal esophagus. No evidence of reflux  esophagitis                           - Mild gastritis. Biopsied.                           - Normal examined duodenum.                           - Multiple small ulcers/aphthae were found in the                            posterior oropharynx bilaterally. Recommendation:           - Patient has a contact number available for                            emergencies. The signs and symptoms of potential                            delayed complications were discussed with the                            patient. Return to normal activities tomorrow.                            Written discharge instructions were provided to the                            patient.                           - Resume previous diet.                           - Continue present medications (omeprazole qam,                            Pepcid PRN).                           - Await pathology  results. Aldean Pipe E. Candis Schatz, MD 11/14/2021 11:26:45 AM This report has been signed electronically.

## 2021-11-14 NOTE — Progress Notes (Signed)
Called to room to assist during endoscopic procedure.  Patient ID and intended procedure confirmed with present staff. Received instructions for my participation in the procedure from the performing physician.  

## 2021-11-14 NOTE — Progress Notes (Signed)
Pt resting comfortably. VSS. Airway intact. SBAR complete to RN. All questions answered.   

## 2021-11-14 NOTE — Patient Instructions (Addendum)
Continue present medications (omeprazole qam and Pepcid PRN)   YOU HAD AN ENDOSCOPIC PROCEDURE TODAY AT THE [facility]:   Refer to the procedure report that was given to you for any specific questions about what was found during the examination.  If the procedure report does not answer your questions, please call your gastroenterologist to clarify.  If you requested that your care partner not be given the details of your procedure findings, then the procedure report has been included in a sealed envelope for you to review at your convenience later.  YOU SHOULD EXPECT: Some feelings of bloating in the abdomen. Passage of more gas than usual.  Walking can help get rid of the air that was put into your GI tract during the procedure and reduce the bloating. If you had a lower endoscopy (such as a colonoscopy or flexible sigmoidoscopy) you may notice spotting of blood in your stool or on the toilet paper. If you underwent a bowel prep for your procedure, you may not have a normal bowel movement for a few days.  Please Note:  You might notice some irritation and congestion in your nose or some drainage.  This is from the oxygen used during your procedure.  There is no need for concern and it should clear up in a day or so.  SYMPTOMS TO REPORT IMMEDIATELY:  Following upper endoscopy (EGD)  Vomiting of blood or coffee ground material  New chest pain or pain under the shoulder blades  Painful or persistently difficult swallowing  New shortness of breath  Fever of 100F or higher  Black, tarry-looking stools  For urgent or emergent issues, a gastroenterologist can be reached at any hour by calling [phone]. Do not use MyChart messaging for urgent concerns.    DIET:  We do recommend a small meal at first, but then you may proceed to your regular diet.  Drink plenty of fluids but you should avoid alcoholic beverages for 24 hours.  ACTIVITY:  You should plan to take it easy for the  rest of today and you should NOT DRIVE or use heavy machinery until tomorrow (because of the sedation medicines used during the test).    FOLLOW UP: Our staff will call the number listed on your records the next business day following your procedure.  We will call around 7:15- 8:00 am to check on you and address any questions or concerns that you may have regarding the information given to you following your procedure. If we do not reach you, we will leave a message.     If any biopsies were taken you will be contacted by phone or by letter within the next 1-3 weeks.  Please call us at [phone] if you have not heard about the biopsies in 3 weeks.    SIGNATURES/CONFIDENTIALITY: You and/or your care partner have signed paperwork which will be entered into your electronic medical record.  These signatures attest to the fact that that the information above on your After Visit Summary has been reviewed and is understood.  Full responsibility of the confidentiality of this discharge information lies with you and/or your care-partner.

## 2021-11-15 ENCOUNTER — Telehealth: Payer: Self-pay | Admitting: *Deleted

## 2021-11-15 NOTE — Telephone Encounter (Signed)
Attempted to call patient for their post-procedure follow-up call. No answer. Left voicemail.   

## 2021-11-19 ENCOUNTER — Encounter: Payer: Self-pay | Admitting: Gastroenterology
